# Patient Record
Sex: Male | Born: 1950 | Race: White | Hispanic: No | Marital: Married | Smoking: Current every day smoker
Health system: Southern US, Community
[De-identification: ages and names within clinical notes are randomized; demographics above are authoritative.]

## PROBLEM LIST (undated history)

## (undated) DIAGNOSIS — K219 Gastro-esophageal reflux disease without esophagitis: Secondary | ICD-10-CM

## (undated) DIAGNOSIS — I7090 Unspecified atherosclerosis: Secondary | ICD-10-CM

## (undated) DIAGNOSIS — I10 Essential (primary) hypertension: Secondary | ICD-10-CM

## (undated) DIAGNOSIS — E785 Hyperlipidemia, unspecified: Secondary | ICD-10-CM

## (undated) DIAGNOSIS — R011 Cardiac murmur, unspecified: Secondary | ICD-10-CM

## (undated) HISTORY — DX: Essential (primary) hypertension: I10

## (undated) HISTORY — PX: VASCULAR SURGERY: SHX849

## (undated) HISTORY — DX: Hyperlipidemia, unspecified: E78.5

## (undated) HISTORY — DX: Gastro-esophageal reflux disease without esophagitis: K21.9

---

## 2003-09-16 HISTORY — PX: HERNIA REPAIR: SHX51

## 2007-07-12 ENCOUNTER — Emergency Department: Payer: Self-pay | Admitting: Emergency Medicine

## 2009-02-27 ENCOUNTER — Emergency Department (HOSPITAL_COMMUNITY): Admission: EM | Admit: 2009-02-27 | Discharge: 2009-02-27 | Payer: Self-pay | Admitting: Emergency Medicine

## 2009-06-25 ENCOUNTER — Emergency Department (HOSPITAL_COMMUNITY): Admission: EM | Admit: 2009-06-25 | Discharge: 2009-06-25 | Payer: Self-pay | Admitting: Emergency Medicine

## 2010-12-19 LAB — URINALYSIS, ROUTINE W REFLEX MICROSCOPIC
Bilirubin Urine: NEGATIVE
Hgb urine dipstick: NEGATIVE
Ketones, ur: NEGATIVE mg/dL
Protein, ur: NEGATIVE mg/dL

## 2010-12-19 LAB — POCT I-STAT, CHEM 8
BUN: 8 mg/dL (ref 6–23)
HCT: 41 % (ref 39.0–52.0)
Hemoglobin: 13.9 g/dL (ref 13.0–17.0)
TCO2: 28 mmol/L (ref 0–100)

## 2012-09-15 DIAGNOSIS — I7090 Unspecified atherosclerosis: Secondary | ICD-10-CM

## 2012-09-15 HISTORY — DX: Unspecified atherosclerosis: I70.90

## 2012-12-14 ENCOUNTER — Ambulatory Visit: Payer: Self-pay | Admitting: Family Medicine

## 2013-01-15 ENCOUNTER — Ambulatory Visit: Payer: Self-pay | Admitting: Family Medicine

## 2013-02-23 ENCOUNTER — Ambulatory Visit: Payer: Self-pay | Admitting: Vascular Surgery

## 2013-03-08 ENCOUNTER — Ambulatory Visit: Payer: Self-pay | Admitting: Vascular Surgery

## 2013-03-08 LAB — CREATININE, SERUM: EGFR (African American): >60

## 2013-03-08 LAB — BUN: BUN: 7 mg/dL (ref 7–18)

## 2014-05-05 ENCOUNTER — Other Ambulatory Visit: Payer: Self-pay | Admitting: Vascular Surgery

## 2014-05-05 LAB — CREATININE, SERUM
Creatinine: 0.94 mg/dL (ref 0.60–1.30)
EGFR (Non-African Amer.): >60

## 2014-06-26 IMAGING — US US EXTREM LOW VENOUS*R*
1 series · 14 of 24 positions shown · non-contrast
Comparison: none

REASON FOR EXAM: STAT CallReport [id number] or cell [phone]  RT pain and
tingling  eval DVT
COMMENTS:

[Series 1: us extrem low venous*right* · 0.09mm/px · 14 of 24 slices shown]
[im 1/24]
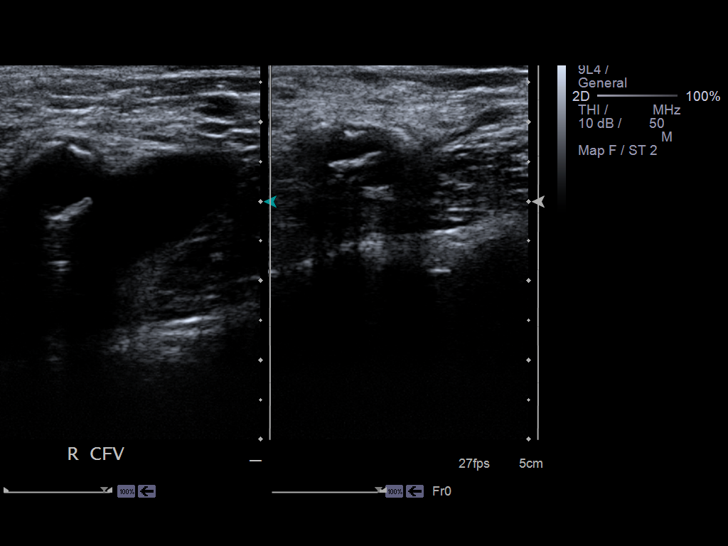
[im 3/24]
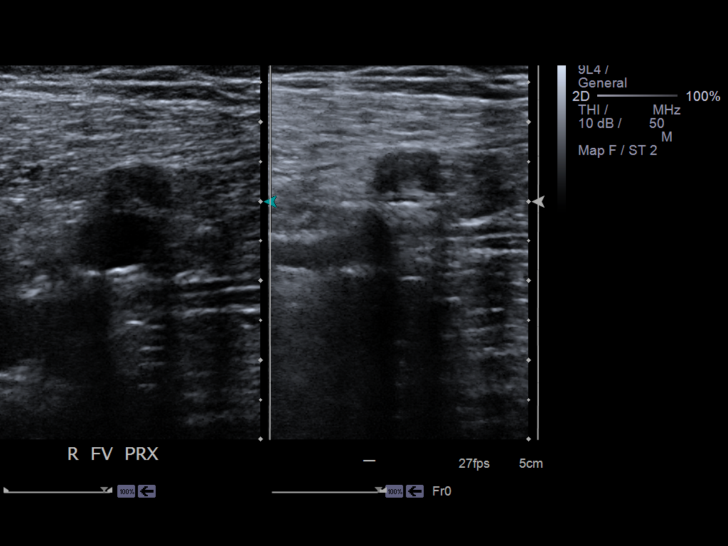
[im 5/24]
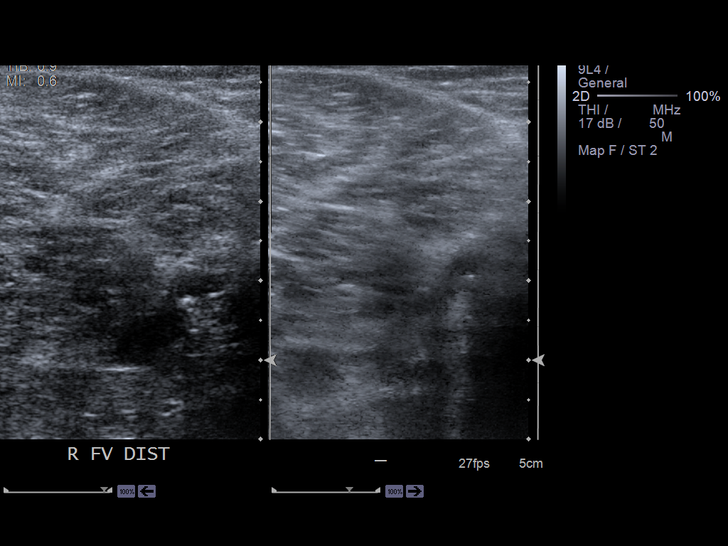
[im 7/24]
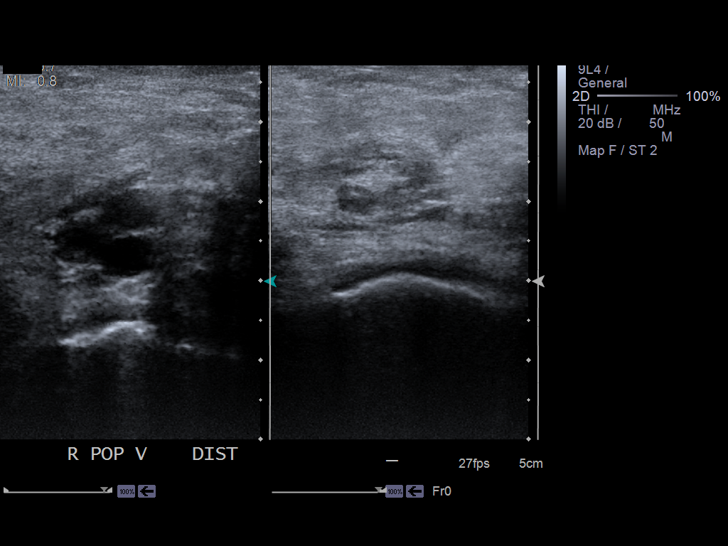
[im 8/24]
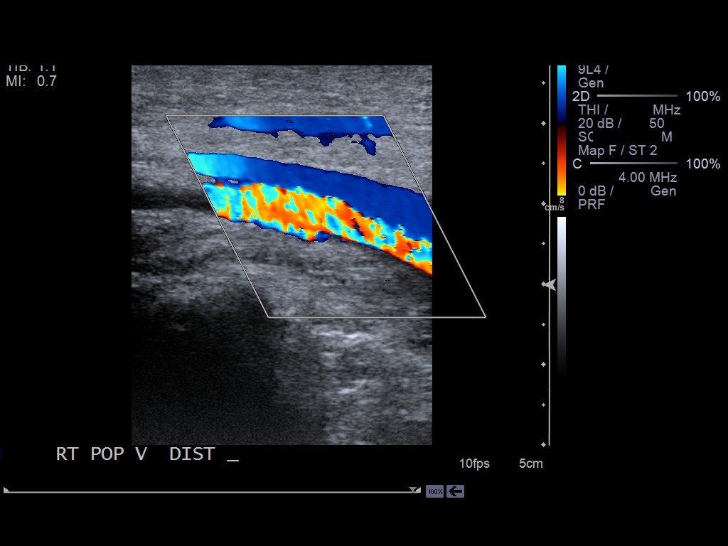
[im 10/24]
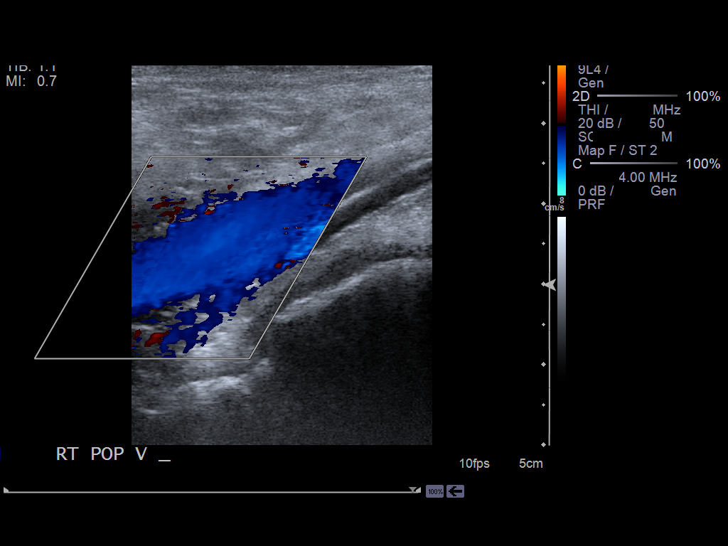
[im 12/24]
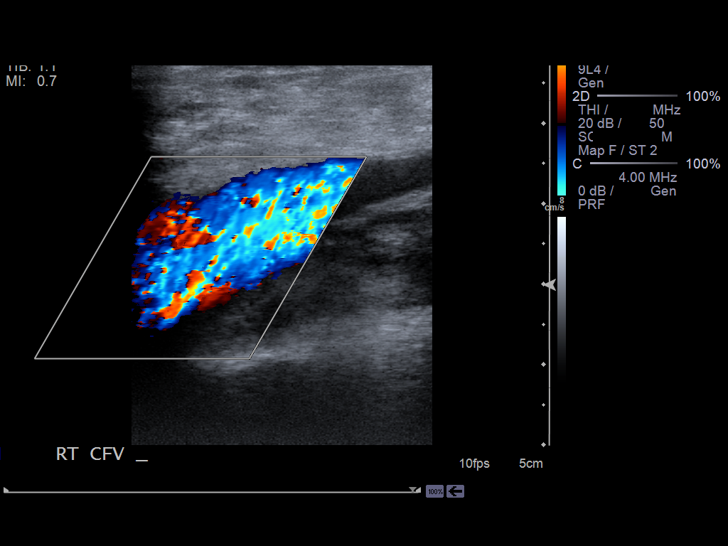
[im 13/24]
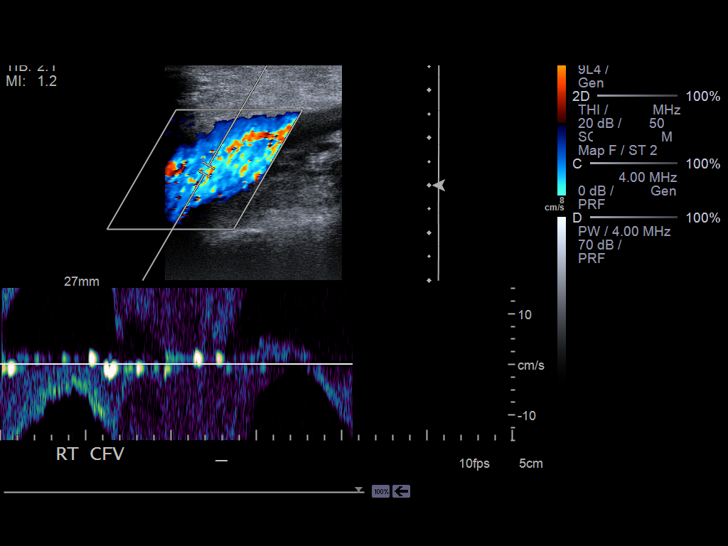
[im 15/24]
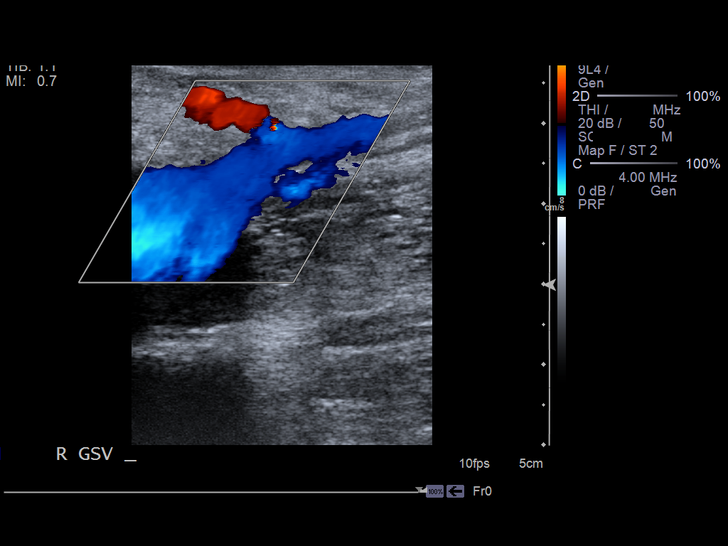
[im 17/24]
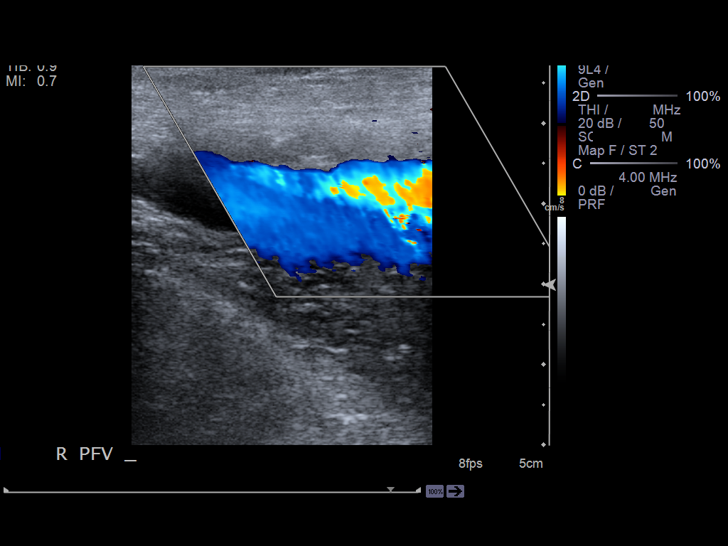
[im 19/24]
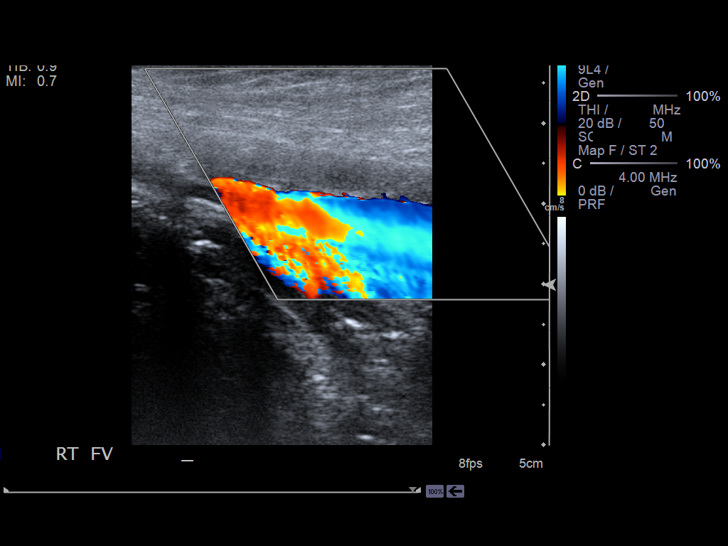
[im 20/24]
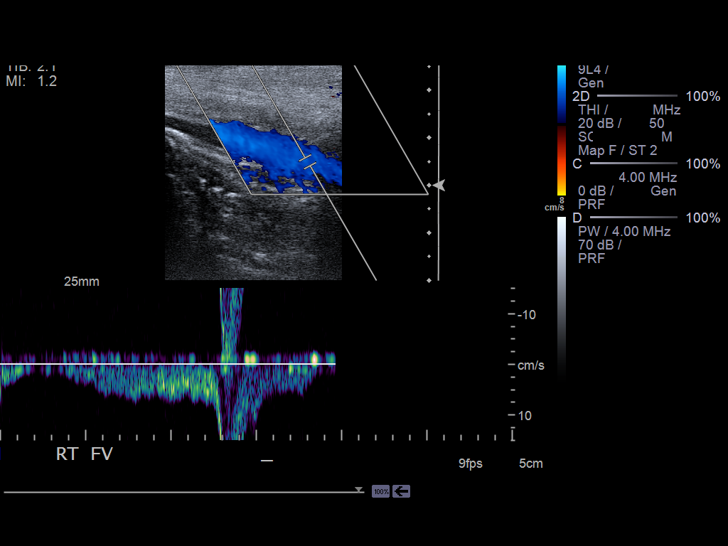
[im 22/24]
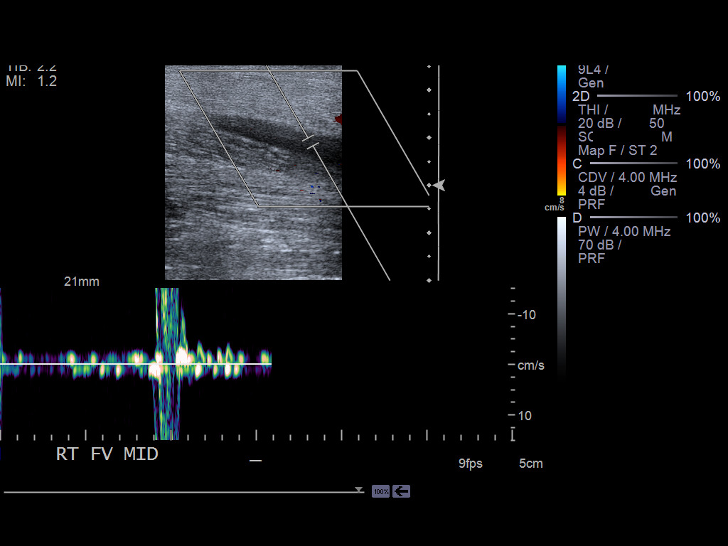
[im 24/24]
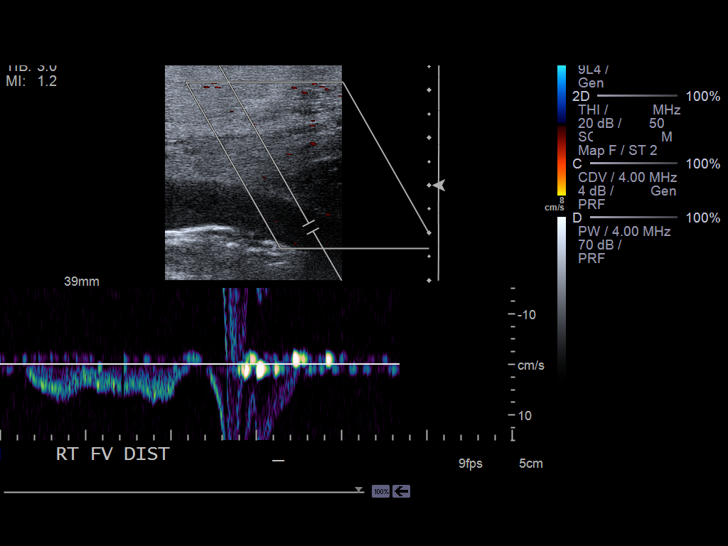

[14 of 24 positions shown; findings below may reference images not displayed]

PROCEDURE:     US  - US DOPPLER LOW EXTR RIGHT  - December 14, 2012 [DATE]

RESULT:     Doppler interrogation of the deep venous system of the right leg
is performed from the common femoral vein through the popliteal vein. Images
demonstrate complete compressibility of the deep venous structures. The
color Doppler and spectral Doppler appearance is within normal limits.
IMPRESSION: No evidence of right lower extremity DVT. No evidence of
popliteal fossa cy[REDACTED]

## 2014-09-05 IMAGING — CT CT ANGIO AORTA RUNOFF
2 series · 12 of 16 positions shown, 14 images · IV contrast (APPLIED)
Comparison: none

REASON FOR EXAM: labs 1st  atherosclorosis with rest pain
COMMENTS:

PROCEDURE:     KCT - KCT ANGIOGRAPHY AORTA RUNOFF  - February 23, 2013  [DATE]
RESULT:     Comparison: None.
TECHNIQUE: Multiple axial images were obtained from the hemidiaphragms to
the feet after the administration of 125 mL Isovue 370 intravenous contrast
according to the CTA runoff protocol. Multiplanar, 3-D, and MIP images were
reviewed on a [employer] multiplanar work station.

[Series 4: 3 soft tissue upper · axial · 0.79mm/px · z∈[-794,-95]mm · 8 of 301 slices shown, 10 images]
[im 34/301  soft-tissue]
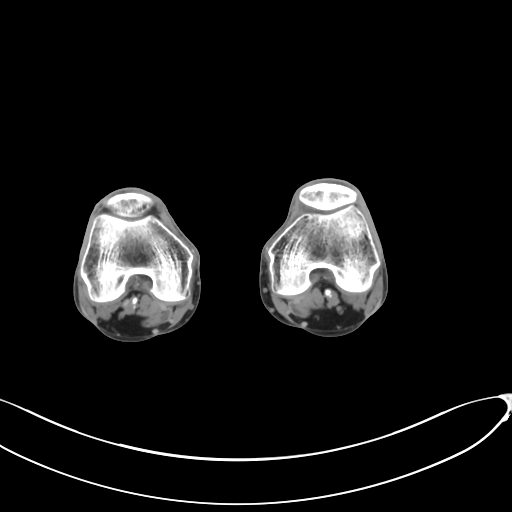
[im 34/301  bone]
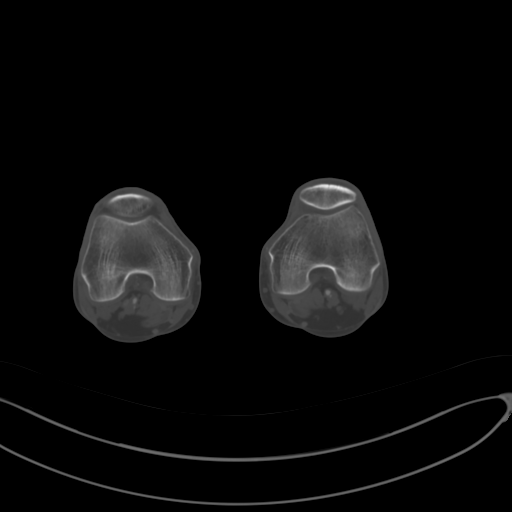
[im 67/301  soft-tissue]
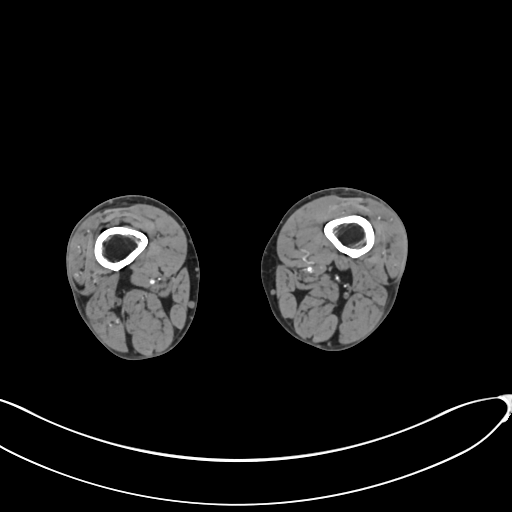
[im 101/301  soft-tissue]
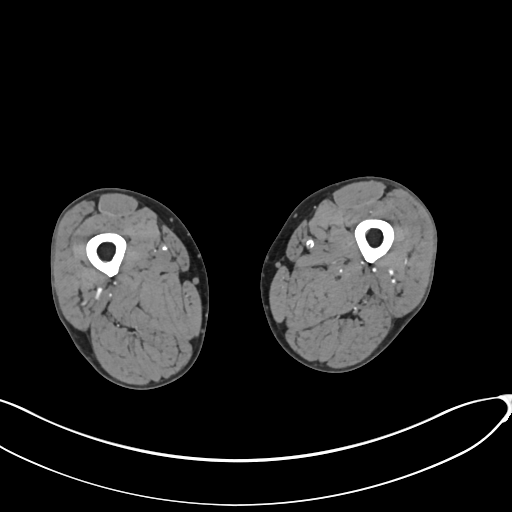
[im 134/301  soft-tissue]
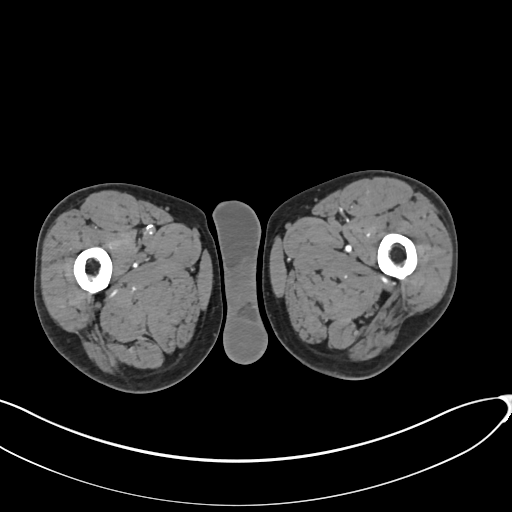
[im 167/301  soft-tissue]
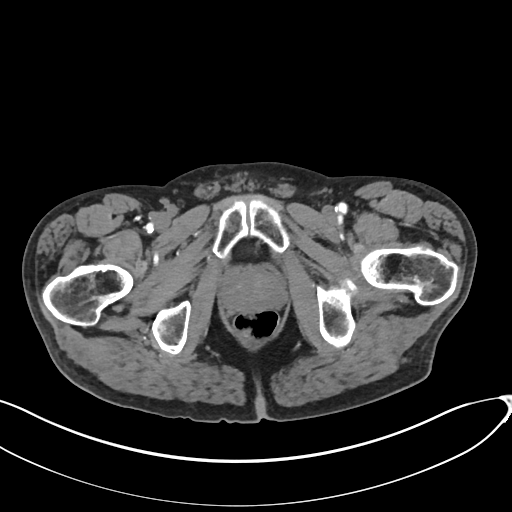
[im 167/301  bone]
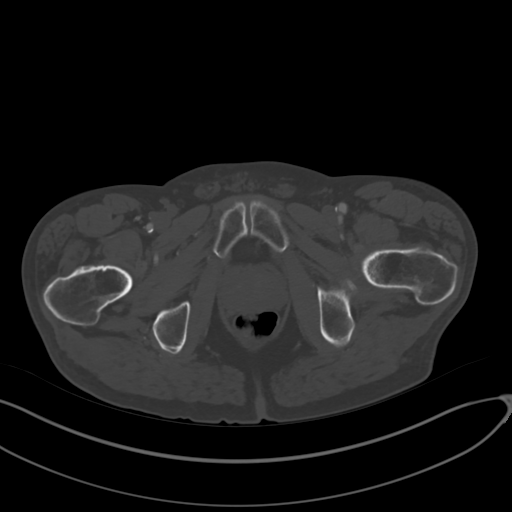
[im 201/301  soft-tissue]
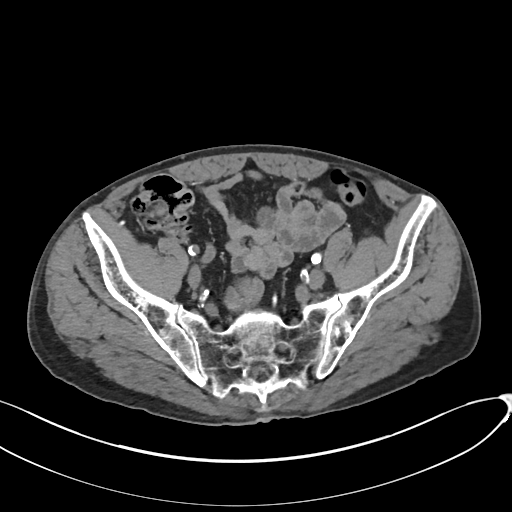
[im 234/301  soft-tissue]
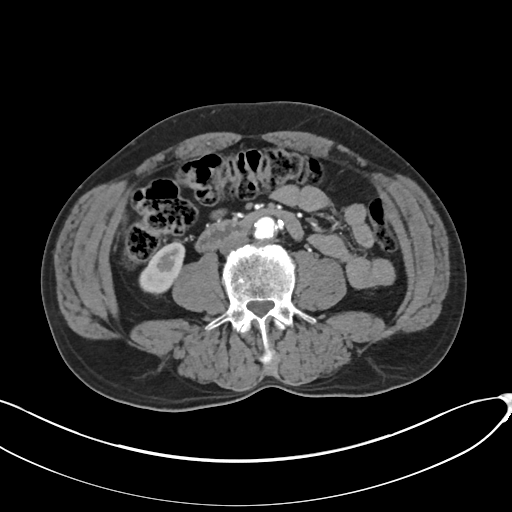
[im 267/301  soft-tissue]
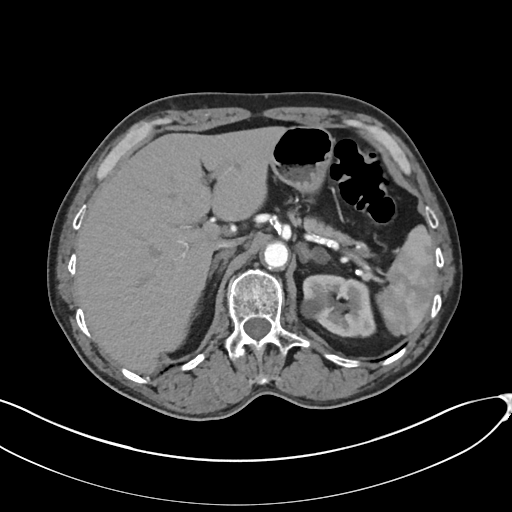

[Series 6: 3 soft tissue lower · axial · 0.69mm/px · z∈[-1202,-851]mm · 4 of 197 slices shown]
[im 40/197  soft-tissue]
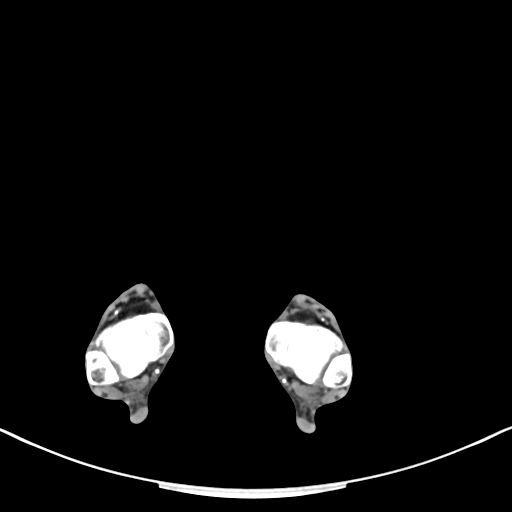
[im 79/197  soft-tissue]
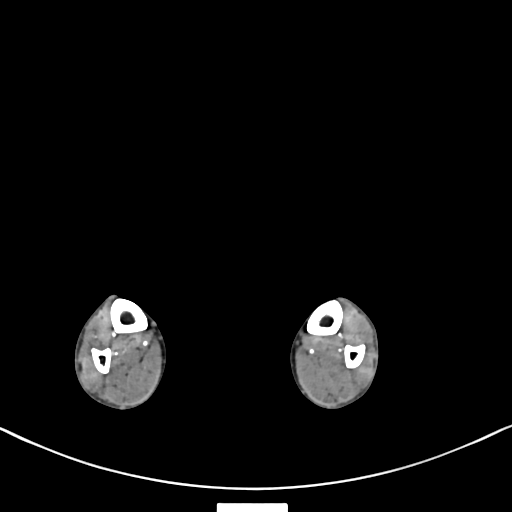
[im 118/197  soft-tissue]
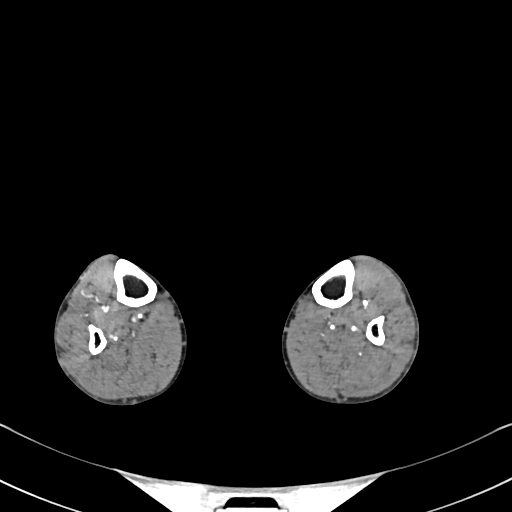
[im 157/197  soft-tissue]
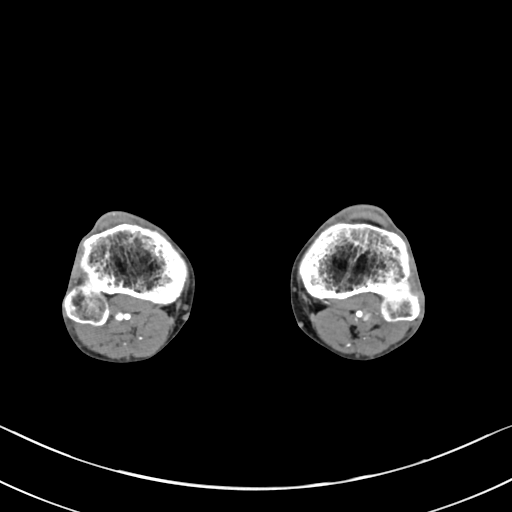

[12 of 16 positions shown; findings below may reference images not displayed]

FINDINGS: The celiac artery, SMA, and IMA are patent. The renal arteries are patent.
There are 2 origins of the right renal artery. Mild atherosclerotic plaques
are demonstrated at the origins of the celiac, SMA, and right renal
arteries. Atherosclerotic left demonstrated in the abdominal aorta. There is
a small ulcerated plaque in the infrarenal abdominal aorta. The abdominal
aorta is normal in caliber.

Atherosclerotic plaque causes approximately 50% stenosis of the left common
iliac artery. There is relative diffuse narrowing of the left external iliac
artery with some areas of approximately 75% stenosis. There are multiple
areas of narrowing in the left internal iliac artery. There is multifocal
narrowing of the left femoral artery with occlusion of the left femoral
artery at the level of the proximal third of the femur The left femoral
artery is reconstituted at the level of the mid femur. There is three-vessel
runoff to the ankle with two-vessel runoff of the anterior tibial and
posterior tibial arteries to the foot.

Complex atherosclerotic plaque in the right common iliac artery causes
approximately 40-50% stenosis. There is occlusion of the right external
iliac artery with reconstitution at the level of the right common femoral
artery. There is mild multifocal narrowing of the right internal iliac
artery. There is occlusion of the right femoral artery at the level of the
bifurcation with the femoral profunda artery. There is reconstitution of the
right femoral artery at the level of the distal third of the right femur.
There is a 3 vessel runoff to the right ankle with two-vessel runoff to the
right foot from the anterior and posterior tibial arteries.

There is a 4 mm subpleural nodule in the right lower lobe, image 14 area
there is suggestion of mild centrilobular emphysema.

Relative arterial phase of contrast limits evaluation of the solid abdominal
organs. Grossly, the liver, gallbladder, and spleen are unremarkable. Small
low-attenuation areas in the pancreatic head and uncinate process appear to
have density of fat and likely represent fat insinuating within the
pancreatic tissue. There multiple small low-attenuation nodule in the
adrenal glands which have density consistent with adrenal adenomas. Small
attenuation lesion in the left kidney is consistent with a cyst.

The small and large bowel are normal in caliber. The appendix is normal. No
aggressive lytic or sclerotic osseous lesions are identified.
IMPRESSION: 1. Occlusion of the right external iliac artery with reconstitution at the
level of the right common femoral artery. There is a long segment occlusion
of the right femoral artery with reconstitution of the distal third of the
right femoral artery.
2. Long segment occlusion of the left femoral artery, with reconstitution at
the level of the mid left femoral artery. Multifocal areas of narrowing of
the left external iliac artery with some areas of approximately 75% stenosis.
3. Indeterminate 4 mm nodule in the right lower lobe. If the patient is at
low risk for lung cancer, no further follow-up is recommended.  If the
patient is at high risk for lung cancer, recommend 12 month follow-up
noncontrast chest CT.

## 2015-01-05 NOTE — Op Note (Signed)
PATIENT NAME:  Jared HartshornSTEWART, Jared M MR#:  [id number] DATE OF BIRTH:  [birth year]  DATE OF PROCEDURE:  03/08/2013  PREOPERATIVE DIAGNOSIS: Atherosclerotic occlusive disease, bilateral lower extremities, with rest pain.   POSTOPERATIVE DIAGNOSIS: Atherosclerotic occlusive disease, bilateral lower extremities, with rest pain.   PROCEDURES PERFORMED:  1. Abdominal aortogram.  2. Bilateral lower extremity distal runoff.  3. Percutaneous transluminal angioplasty and stent placement of the right external iliac artery.  4. Percutaneous transluminal angioplasty and stent placement of the left external iliac artery.   PROCEDURE PERFORMED BY: Renford DillsGregory G. Schnier, M.D.   SEDATION: Versed 4 mg plus fentanyl 150 mcg administered IV. Continuous ECG, pulse oximetry and cardiopulmonary monitoring is performed throughout the entire procedure by the interventional radiology nurse. Total sedation time was approximately 1 hour.   ACCESS: A 6-French sheath left common femoral artery.   FLUOROSCOPY TIME: 7.7 minutes.   CONTRAST: Isovue 125 mL.   INDICATIONS: Mr. Jared RenoStewart is a 64 year old gentleman who presented to the office with increasing pain in his lower extremities. Physical examination as well as noninvasive studies demonstrated profound atherosclerotic occlusive disease, including occlusion of the right external iliac artery. He subsequently was informed of several options, including both intervention as well as operative. The risks and benefits for angiography and possible intervention were reviewed. All questions answered. The patient has agreed to proceed.   DESCRIPTION OF PROCEDURE: The patient is taken to the special procedure suite, placed in the supine position. After adequate sedation is achieved, both groins are prepped and draped in sterile fashion. Ultrasound is placed in a sterile sleeve. Ultrasound is utilized secondary to a lack of appropriate landmarks and to avoid vascular injury. Under direct  ultrasound visualization, the common femoral artery is identified. It is echolucent and pulsatile, indicating patency. Image is recorded for the permanent record. Lidocaine 1% is infiltrated in the soft tissues under ultrasound guidance, and then a micropuncture needle is inserted into the anterior wall of the common femoral artery with continuous ultrasound visualization. Microwire followed by microsheath, J-wire followed by a 5-French sheath and 5-French pigtail catheter. Pigtail catheter is positioned at the level of T12, and AP projection of the aorta is obtained.   The pigtail catheter is then repositioned to above the bifurcation, and bilateral oblique views of the pelvis are obtained. Detector is then placed in the AP position, and bilateral distal runoff is obtained.   Using the pigtail catheter and a stiff angled Glidewire, the aortic bifurcation is crossed and the catheter and wire negotiated down to the occlusion. After manipulation of the wire, the occlusion was crossed. The pigtail catheter was then advanced and positioned in the profunda femoris and profunda runoff was obtained, demonstrating intraluminal positioning and successful crossing of the occlusion. A total of 5000 units of heparin was given, 2000 initially after aortogram showed left external iliac stenosis was near occlusive around the catheter and subsequently another 3000 after crossing the right external iliac artery occlusion. Magic Torque wire was then advanced down into the distal profunda and the 5-French sheath exchanged for a 6-French Balkan sheath which was positioned with its tip in the distal common iliac on the right. Initially, a 6 x 10 balloon was used to angioplasty the right external iliac artery. Angioplasty was to 12 atmospheres for 1 minute. Followup angiography demonstrated significant dissection. Therefore, an 8 x 100 Absolute Pro stent was deployed from just above the level of the ilioinguinal ligament to the  origin of the right external iliac artery. It was postdilated  with a 7 x 100 balloon. Followup angiography demonstrated wide patency of the external iliac artery with rapid flow of contrast and preservation of the profunda in its distal runoff.   The Balkan sheath was then exchanged for a 6-French 11 cm sheath. Retrograde injection of contrast by hand was used to localize the critical stenosis and subsequently, a 6 x 2 balloon was positioned across the lesion which was located just distal to the origin of the external iliac. Followup angiography demonstrated flow-limiting dissection, and subsequently a 7 x 39 Omnilink stent was deployed across this beginning at the origin of the external iliac and extending distally. Followup angiography after deploying the Omnilink stent demonstrated wide patency with 0% residual stenosis. There was preservation of the femoral runoff.   Oblique view of the groin was then obtained, and a Mynx device was successfully deployed. There were no immediate complications.   INTERPRETATION: The abdominal aorta is opacified with a bolus injection of contrast. It is patent throughout its course. It is diffusely diseased, but there are no hemodynamically significant stenoses. Bilateral common iliac arteries again show diffuse disease but no focal hemodynamically significant stenoses. The internal iliac arteries are profoundly diseased with near occlusion on the left and occlusion at the origin on the right. The right external iliac artery is occluded, and there is reconstitution of the common femoral via pelvic branches as well as via the lateral circumflex. There is approximately 1 cm of distal external iliac artery which is patent based on retrograde filling. On the left, the external iliac artery demonstrates a focal stenosis which is a subtotal occlusion. A 5-French pigtail catheter is near occlusive through this lesion.   The right common femoral is patent. There is a moderate  disease noted with a widely patent profunda and good collaterals to reconstitute an above-knee popliteal. SFA is occluded down to the above-knee popliteal. Trifurcation is patent, and there appears to be 3-vessel runoff to the foot.   The left common femoral is patent. There is a focal 50% to 60% stenosis at the origin of the profunda femoris on the left. The SFA occludes 1 to 2 cm distal to its origin. There again is reconstitution of the above-knee popliteal. There is 3-vessel runoff noted on imaging of the tibials.   Following angioplasty and subsequent stent placement in the right external and left external, there is now wide patency of the aortoiliac system filling the common femoral and profunda femoris arteries bilaterally.   SUMMARY: Successful recanalization of the iliac system as described above.   ____________________________ Renford Dills, MD ggs:gb D: 03/08/2013 18:42:09 ET T: 03/09/2013 02:16:16 ET JOB#: [id number]  cc: Renford Dills, MD, <Dictator> [facility] Hilario Quarry, MD Renford Dills MD ELECTRONICALLY SIGNED 03/22/2013 14:29

## 2015-02-16 ENCOUNTER — Encounter: Payer: Self-pay | Admitting: Podiatry

## 2015-02-16 ENCOUNTER — Ambulatory Visit (INDEPENDENT_AMBULATORY_CARE_PROVIDER_SITE_OTHER): Payer: BC Managed Care – PPO | Admitting: Podiatry

## 2015-02-16 VITALS — BP 136/82 | HR 75 | Resp 16 | Ht 70.0 in | Wt 164.2 lb

## 2015-02-16 DIAGNOSIS — M2040 Other hammer toe(s) (acquired), unspecified foot: Secondary | ICD-10-CM | POA: Diagnosis not present

## 2015-02-16 DIAGNOSIS — Q828 Other specified congenital malformations of skin: Secondary | ICD-10-CM | POA: Diagnosis not present

## 2015-02-16 NOTE — Progress Notes (Signed)
   Subjective:    Patient ID: Jared HartshornDelbert M Nanez, male    DOB: [birth year], 64 y.o.   MRN: [id number]  HPI Hard places on the balls of my feet , right foot worse than the left porokeratosis on the ball of foot    Review of Systems     Objective:   Physical Exam: I have reviewed his past medical history medications allergies surgery social history review of systems and work history. Pulses are palpable bilateral. Neurologic sensorium is intact per Semmes-Weinstein monofilament. Deep tendon reflexes are intact bilateral and muscle strength is 5 over 5 dorsiflexors plantar flexors and inverters everters all intrinsic musculature is intact. Orthopedic evaluation does demonstrate some mild flexible hammertoe deformities with flexor stabilization. Cutaneous evaluation of straight supple well-hydrated cutis with exception of poor keratoma's overlying a mild tyloma second metatarsophalangeal joint plantarly. There is no skin breakdown and I see no signs of infection.        Assessment & Plan:  Assessment: Capsulitis hammertoe deformity poor keratoses bilateral.  Plan: Debridement of all reactive hyperkeratosis. Discussed appropriate shoe gear and the fact that he needs to purchase a new pair of shoes. The shoes that he is currently wearing has holes in the bottom. Follow-up with me as needed

## 2015-04-18 ENCOUNTER — Telehealth: Payer: Self-pay | Admitting: Family Medicine

## 2015-04-18 MED ORDER — CARVEDILOL 12.5 MG PO TABS: 12.5000 mg | ORAL_TABLET | Freq: Two times a day (BID) | ORAL | Status: DC

## 2015-04-18 NOTE — Telephone Encounter (Signed)
1 Month Supply of medication has been refilled and sent to [facility], patient has been notified to schedule a follow up appointment be next refill

## 2015-04-23 ENCOUNTER — Ambulatory Visit: Payer: Self-pay | Admitting: Family Medicine

## 2015-04-27 ENCOUNTER — Ambulatory Visit (INDEPENDENT_AMBULATORY_CARE_PROVIDER_SITE_OTHER): Payer: BC Managed Care – PPO | Admitting: Family Medicine

## 2015-04-27 ENCOUNTER — Encounter: Payer: Self-pay | Admitting: Family Medicine

## 2015-04-27 VITALS — BP 132/71 | HR 87 | Temp 98.5°F | Resp 18 | Ht 70.0 in | Wt 163.4 lb

## 2015-04-27 DIAGNOSIS — E785 Hyperlipidemia, unspecified: Secondary | ICD-10-CM | POA: Insufficient documentation

## 2015-04-27 DIAGNOSIS — E538 Deficiency of other specified B group vitamins: Secondary | ICD-10-CM | POA: Diagnosis not present

## 2015-04-27 DIAGNOSIS — R5383 Other fatigue: Secondary | ICD-10-CM | POA: Diagnosis not present

## 2015-04-27 DIAGNOSIS — I10 Essential (primary) hypertension: Secondary | ICD-10-CM | POA: Insufficient documentation

## 2015-04-27 DIAGNOSIS — M5416 Radiculopathy, lumbar region: Secondary | ICD-10-CM | POA: Insufficient documentation

## 2015-04-27 DIAGNOSIS — M792 Neuralgia and neuritis, unspecified: Secondary | ICD-10-CM | POA: Insufficient documentation

## 2015-04-27 MED ORDER — CARVEDILOL 12.5 MG PO TABS: 12.5000 mg | ORAL_TABLET | Freq: Two times a day (BID) | ORAL | Status: DC

## 2015-04-27 MED ORDER — TRIAMTERENE-HCTZ 75-50 MG PO TABS: 1.0000 | ORAL_TABLET | Freq: Every day | ORAL | Status: DC

## 2015-04-27 NOTE — Progress Notes (Signed)
Name: Jared Harmon   MRN: [id number]    DOB: [birth year]   Date:04/27/2015       Progress Note  Subjective  Chief Complaint  Chief Complaint  Patient presents with  . Follow-up    3 mo.  . Hypertension  . Gastrophageal Reflux  . Medication Refill    carvedilol 12.5mg  / triamterene 75/50mg     Hypertension This is a chronic problem. The problem is controlled. Pertinent negatives include no blurred vision, chest pain, headaches, palpitations or shortness of breath. Past treatments include beta blockers and diuretics. There is no history of kidney disease, CAD/MI or CVA.  Hyperlipidemia This is a chronic problem. Recent lipid tests were reviewed and are high. Pertinent negatives include no chest pain, myalgias or shortness of breath. He is currently on no antihyperlipidemic treatment.     Past Medical History  Diagnosis Date  . Allergy   . Hypertension   . GERD (gastroesophageal reflux disease)     Past Surgical History  Procedure Laterality Date  . Hernia repair      Family History  Problem Relation Age of Onset  . Stroke Father     Social History   Social History  . Marital Status: Married    Spouse Name: N/A  . Number of Children: N/A  . Years of Education: N/A   Occupational History  . Not on file.   Social History Main Topics  . Smoking status: Current Every Day Smoker  . Smokeless tobacco: Not on file  . Alcohol Use: Not on file  . Drug Use: Not on file  . Sexual Activity: Not on file   Other Topics Concern  . Not on file   Social History Narrative     Current outpatient prescriptions:  .  aspirin 81 MG tablet, Take 81 mg by mouth daily., Disp: , Rfl:  .  carvedilol (COREG) 12.5 MG tablet, Take 1 tablet (12.5 mg total) by mouth 2 (two) times daily., Disp: 60 tablet, Rfl: 0 .  [facility] VITAMIN B12 1000 MCG tablet, Take 1,000 mcg by mouth daily., Disp: , Rfl: 2 .  gabapentin (NEURONTIN) 300 MG capsule, Take 300 mg by mouth 3 (three) times daily.,  Disp: , Rfl: 6 .  triamterene-hydrochlorothiazide (MAXZIDE) 75-50 MG per tablet, Take 1 tablet by mouth daily., Disp: , Rfl: 0  No Known Allergies   Review of Systems  Eyes: Negative for blurred vision.  Respiratory: Negative for shortness of breath.   Cardiovascular: Negative for chest pain and palpitations.  Musculoskeletal: Negative for myalgias.  Neurological: Negative for headaches.      Objective  Filed Vitals:   04/27/15 0955  BP: 132/71  Pulse: 87  Temp: 98.5 F (36.9 C)  TempSrc: Oral  Resp: 18  Height: 5\' 10"  (1.778 m)  Weight: 163 lb 6.4 oz (74.118 kg)  SpO2: 96%    Physical Exam  Constitutional: He is oriented to person, place, and time and well-developed, well-nourished, and in no distress.  HENT:  Head: Normocephalic and atraumatic.  Cardiovascular: Normal rate and regular rhythm.   Pulmonary/Chest: Effort normal and breath sounds normal.  Musculoskeletal: He exhibits no edema.  Neurological: He is alert and oriented to person, place, and time.  Psychiatric: Memory, affect and judgment normal.  Nursing note and vitals reviewed.   Assessment & Plan  1. Essential hypertension Blood pressure stable on present therapy. - carvedilol (COREG) 12.5 MG tablet; Take 1 tablet (12.5 mg total) by mouth 2 (two) times daily.  Dispense:  180 tablet; Refill: 0 - triamterene-hydrochlorothiazide (MAXZIDE) 75-50 MG per tablet; Take 1 tablet by mouth daily.  Dispense: 90 tablet; Refill: 0  2. Dyslipidemia Recheck FLP and consider starting patient on a statin. - Lipid Profile - Comprehensive metabolic panel  3. B12 deficiency  - B12  4. Other fatigue  - TSH   Alfreda Hammad Asad A. [facility] 04/27/2015 10:23 AM

## 2015-05-05 LAB — COMPREHENSIVE METABOLIC PANEL
A/G RATIO: 1.9 (ref 1.1–2.5)
ALBUMIN: 4.5 g/dL (ref 3.6–4.8)
ALK PHOS: 86 IU/L (ref 39–117)
ALT: 13 IU/L (ref 0–44)
AST: 15 IU/L (ref 0–40)
BILIRUBIN TOTAL: 0.4 mg/dL (ref 0.0–1.2)
BUN / CREAT RATIO: 11 (ref 10–22)
BUN: 10 mg/dL (ref 8–27)
CHLORIDE: 91 mmol/L — AB (ref 97–108)
CO2: 28 mmol/L (ref 18–29)
CREATININE: 0.92 mg/dL (ref 0.76–1.27)
Calcium: 9.5 mg/dL (ref 8.6–10.2)
GFR calc Af Amer: 101 mL/min/{1.73_m2} (ref >59)
GFR calc non Af Amer: 87 mL/min/{1.73_m2} (ref >59)
GLOBULIN, TOTAL: 2.4 g/dL (ref 1.5–4.5)
Glucose: 111 mg/dL — ABNORMAL HIGH (ref 65–99)
POTASSIUM: 3.5 mmol/L (ref 3.5–5.2)
SODIUM: 135 mmol/L (ref 134–144)
Total Protein: 6.9 g/dL (ref 6.0–8.5)

## 2015-05-05 LAB — VITAMIN B12: VITAMIN B 12: 636 pg/mL (ref 211–946)

## 2015-05-05 LAB — LIPID PANEL
CHOLESTEROL TOTAL: 171 mg/dL (ref 100–199)
Chol/HDL Ratio: 4.8 ratio units (ref 0.0–5.0)
HDL: 36 mg/dL — AB (ref >39)
LDL CALC: 115 mg/dL — AB (ref 0–99)
Triglycerides: 101 mg/dL (ref 0–149)
VLDL CHOLESTEROL CAL: 20 mg/dL (ref 5–40)

## 2015-05-05 LAB — TSH: TSH: 1.19 u[IU]/mL (ref 0.450–4.500)

## 2015-05-14 ENCOUNTER — Telehealth: Payer: Self-pay | Admitting: Family Medicine

## 2015-05-14 MED ORDER — ATORVASTATIN CALCIUM 20 MG PO TABS: 20.0000 mg | ORAL_TABLET | Freq: Every day | ORAL | Status: DC

## 2015-05-14 NOTE — Telephone Encounter (Signed)
lipitor 20 mg has been sent to [facility] per Dr. Sherryll Burger

## 2015-06-16 ENCOUNTER — Other Ambulatory Visit: Payer: Self-pay | Admitting: Family Medicine

## 2015-07-09 ENCOUNTER — Other Ambulatory Visit: Payer: Self-pay | Admitting: Family Medicine

## 2015-07-26 ENCOUNTER — Other Ambulatory Visit: Payer: Self-pay | Admitting: Family Medicine

## 2015-07-30 ENCOUNTER — Encounter: Payer: Self-pay | Admitting: Family Medicine

## 2015-07-30 ENCOUNTER — Ambulatory Visit (INDEPENDENT_AMBULATORY_CARE_PROVIDER_SITE_OTHER): Payer: BC Managed Care – PPO | Admitting: Family Medicine

## 2015-07-30 VITALS — BP 128/76 | HR 79 | Temp 98.0°F | Resp 18 | Ht 70.0 in | Wt 163.8 lb

## 2015-07-30 DIAGNOSIS — G629 Polyneuropathy, unspecified: Secondary | ICD-10-CM | POA: Insufficient documentation

## 2015-07-30 DIAGNOSIS — E785 Hyperlipidemia, unspecified: Secondary | ICD-10-CM | POA: Diagnosis not present

## 2015-07-30 DIAGNOSIS — R569 Unspecified convulsions: Secondary | ICD-10-CM | POA: Diagnosis not present

## 2015-07-30 DIAGNOSIS — M5030 Other cervical disc degeneration, unspecified cervical region: Secondary | ICD-10-CM | POA: Insufficient documentation

## 2015-07-30 DIAGNOSIS — M48062 Spinal stenosis, lumbar region with neurogenic claudication: Secondary | ICD-10-CM | POA: Insufficient documentation

## 2015-07-30 DIAGNOSIS — I10 Essential (primary) hypertension: Secondary | ICD-10-CM

## 2015-07-30 DIAGNOSIS — G9519 Other vascular myelopathies: Secondary | ICD-10-CM | POA: Insufficient documentation

## 2015-07-30 NOTE — Progress Notes (Signed)
Name: Jared Harmon   MRN: [id number]    DOB: [birth year]   Date:07/30/2015       Progress Note  Subjective  Chief Complaint  Chief Complaint  Patient presents with  . Follow-up    3 mo  . Hyperlipidemia  . Hypertension    Hyperlipidemia This is a chronic problem. The problem is controlled. Recent lipid tests were reviewed and are high (elevated LDL). Pertinent negatives include no chest pain, leg pain, myalgias or shortness of breath. Current antihyperlipidemic treatment includes statins. There are no compliance problems.  Risk factors for coronary artery disease include dyslipidemia, male sex and hypertension.  Hypertension This is a chronic problem. The problem is unchanged. The problem is controlled. Pertinent negatives include no blurred vision, chest pain, headaches, palpitations or shortness of breath. Past treatments include beta blockers and diuretics. There is no history of kidney disease, CAD/MI or CVA.  Seizures  This is a new problem. Episode onset: 3 weeks ago. The problem has been resolved. There was 1 seizure. Associated symptoms include vomiting. Pertinent negatives include no headaches, no speech difficulty, no visual disturbance, no chest pain, no cough, no nausea and no muscle weakness. Characteristics include bladder incontinence, loss of consciousness and bit tongue. Characteristics do not include bowel incontinence, rhythmic jerking or apnea. The episode was witnessed (by wife). The seizure(s) had no focality. Possible causes do not include sleep deprivation or recent illness.    Past Medical History  Diagnosis Date  . Allergy   . Hypertension   . GERD (gastroesophageal reflux disease)     Past Surgical History  Procedure Laterality Date  . Hernia repair      Family History  Problem Relation Age of Onset  . Stroke Father     Social History   Social History  . Marital Status: Married    Spouse Name: N/A  . Number of Children: N/A  . Years of  Education: N/A   Occupational History  . Not on file.   Social History Main Topics  . Smoking status: Current Every Day Smoker  . Smokeless tobacco: Not on file  . Alcohol Use: Not on file  . Drug Use: Not on file  . Sexual Activity: Not on file   Other Topics Concern  . Not on file   Social History Narrative     Current outpatient prescriptions:  .  aspirin 81 MG tablet, Take 81 mg by mouth daily., Disp: , Rfl:  .  atorvastatin (LIPITOR) 20 MG tablet, TAKE 1 TABLET (20 MG TOTAL) BY MOUTH AT BEDTIME., Disp: 30 tablet, Rfl: 1 .  carvedilol (COREG) 12.5 MG tablet, TAKE 1 TABLET (12.5 MG TOTAL) BY MOUTH 2 (TWO) TIMES DAILY., Disp: 180 tablet, Rfl: 0 .  [facility] VITAMIN B12 1000 MCG tablet, Take 1,000 mcg by mouth daily., Disp: , Rfl: 2 .  gabapentin (NEURONTIN) 300 MG capsule, Take 300 mg by mouth 3 (three) times daily., Disp: , Rfl: 6 .  triamterene-hydrochlorothiazide (MAXZIDE) 75-50 MG tablet, TAKE 1 TABLET BY MOUTH EVERY DAY, Disp: 90 tablet, Rfl: 0  No Known Allergies   Review of Systems  Constitutional: Negative for fever and chills.  Eyes: Negative for blurred vision and visual disturbance.  Respiratory: Negative for apnea, cough and shortness of breath.   Cardiovascular: Negative for chest pain and palpitations.  Gastrointestinal: Positive for vomiting. Negative for nausea and bowel incontinence.  Genitourinary: Positive for bladder incontinence.  Musculoskeletal: Negative for myalgias.  Neurological: Positive for seizures and loss of  consciousness. Negative for speech difficulty and headaches.     Objective  Filed Vitals:   07/30/15 0844  BP: 128/76  Pulse: 79  Temp: 98 F (36.7 C)  TempSrc: Oral  Resp: 18  Height: 5\' 10"  (1.778 m)  Weight: 163 lb 12.8 oz (74.299 kg)  SpO2: 97%    Physical Exam  Constitutional: He is oriented to person, place, and time and well-developed, well-nourished, and in no distress.  HENT:  Head: Normocephalic and atraumatic.   Eyes: Pupils are equal, round, and reactive to light.  Cardiovascular: Normal rate, regular rhythm and normal heart sounds.   No murmur heard. Pulmonary/Chest: Effort normal and breath sounds normal. He has no rales.  Musculoskeletal: Normal range of motion.  Neurological: He is alert and oriented to person, place, and time.  Skin: Skin is warm and dry.  Psychiatric: Mood, memory, affect and judgment normal.  Nursing note and vitals reviewed.   Assessment & Plan  1. Essential hypertension BP at goal. - Comprehensive Metabolic Panel (CMET)  2. Dyslipidemia  Started on statin therapy since August 2016 for elevated LDL. No side effects. Recheck today. - Lipid Profile - Comprehensive Metabolic Panel (CMET)  3. Observed seizure-like activity (HCC)  Prior study of seizure according to wife, which was never evaluated. No obvious factors and unclear etiology at present. We'll refer to neurology for complete workup including EEG.  - CBC with Differential - Comprehensive Metabolic Panel (CMET) - Ambulatory referral to Neurology   [facility] Asad A. [facility] 07/30/2015 9:20 AM

## 2015-08-27 ENCOUNTER — Other Ambulatory Visit: Payer: Self-pay | Admitting: Family Medicine

## 2015-11-06 ENCOUNTER — Ambulatory Visit: Payer: BC Managed Care – PPO | Admitting: Family Medicine

## 2016-01-21 ENCOUNTER — Other Ambulatory Visit: Payer: Self-pay | Admitting: Family Medicine

## 2016-01-22 ENCOUNTER — Other Ambulatory Visit: Payer: Self-pay | Admitting: Family Medicine

## 2016-02-20 ENCOUNTER — Encounter: Payer: Self-pay | Admitting: Family Medicine

## 2016-02-20 ENCOUNTER — Ambulatory Visit (INDEPENDENT_AMBULATORY_CARE_PROVIDER_SITE_OTHER): Payer: BC Managed Care – PPO | Admitting: Family Medicine

## 2016-02-20 VITALS — BP 126/78 | HR 81 | Temp 97.7°F | Resp 17 | Ht 70.0 in | Wt 157.9 lb

## 2016-02-20 DIAGNOSIS — I10 Essential (primary) hypertension: Secondary | ICD-10-CM

## 2016-02-20 DIAGNOSIS — R739 Hyperglycemia, unspecified: Secondary | ICD-10-CM

## 2016-02-20 DIAGNOSIS — E785 Hyperlipidemia, unspecified: Secondary | ICD-10-CM | POA: Diagnosis not present

## 2016-02-20 LAB — POCT GLYCOSYLATED HEMOGLOBIN (HGB A1C): HEMOGLOBIN A1C: 6.1

## 2016-02-20 LAB — GLUCOSE, POCT (MANUAL RESULT ENTRY): POC Glucose: 121 mg/dl — AB (ref 70–99)

## 2016-02-20 MED ORDER — TRIAMTERENE-HCTZ 75-50 MG PO TABS: 1.0000 | ORAL_TABLET | Freq: Every day | ORAL | Status: DC

## 2016-02-20 MED ORDER — CARVEDILOL 12.5 MG PO TABS: 12.5000 mg | ORAL_TABLET | Freq: Every day | ORAL | Status: DC

## 2016-02-20 MED ORDER — ATORVASTATIN CALCIUM 20 MG PO TABS: 20.0000 mg | ORAL_TABLET | Freq: Every day | ORAL | Status: DC

## 2016-02-20 NOTE — Progress Notes (Signed)
Name: Jared HartshornDelbert M Harmon   MRN: [id number]    DOB: [birth year]   Date:02/20/2016       Progress Note  Subjective  Chief Complaint  Chief Complaint  Patient presents with  . Medication Refill    carvediol 12.5 mg / atorvastatin / triamterene 75-50 mg     Hyperlipidemia This is a chronic problem. The problem is uncontrolled. Recent lipid tests were reviewed and are high (elevated LDL). Pertinent negatives include no chest pain, leg pain, myalgias or shortness of breath. Current antihyperlipidemic treatment includes statins (Stopped Lipitor 6 months ago.). Compliance problems include medication cost.  Risk factors for coronary artery disease include dyslipidemia, male sex and hypertension.  Hypertension This is a chronic problem. The problem is unchanged. The problem is controlled. Pertinent negatives include no blurred vision, chest pain, headaches, palpitations or shortness of breath. Past treatments include beta blockers and diuretics. There is no history of kidney disease, CAD/MI or CVA.    Past Medical History  Diagnosis Date  . Allergy   . Hypertension   . GERD (gastroesophageal reflux disease)     Past Surgical History  Procedure Laterality Date  . Hernia repair      Family History  Problem Relation Age of Onset  . Stroke Father     Social History   Social History  . Marital Status: Married    Spouse Name: N/A  . Number of Children: N/A  . Years of Education: N/A   Occupational History  . Not on file.   Social History Main Topics  . Smoking status: Current Every Day Smoker  . Smokeless tobacco: Not on file  . Alcohol Use: Not on file  . Drug Use: Not on file  . Sexual Activity: Not on file   Other Topics Concern  . Not on file   Social History Narrative     Current outpatient prescriptions:  .  aspirin 81 MG tablet, Take 81 mg by mouth daily., Disp: , Rfl:  .  atorvastatin (LIPITOR) 20 MG tablet, TAKE 1 TABLET (20 MG TOTAL) BY MOUTH AT BEDTIME., Disp: 30  tablet, Rfl: 1 .  carvedilol (COREG) 12.5 MG tablet, TAKE 1 TABLET (12.5 MG TOTAL) BY MOUTH 2 (TWO) TIMES DAILY., Disp: 180 tablet, Rfl: 0 .  gabapentin (NEURONTIN) 300 MG capsule, Take 300 mg by mouth 3 (three) times daily., Disp: , Rfl: 6 .  triamterene-hydrochlorothiazide (MAXZIDE) 75-50 MG tablet, TAKE 1 TABLET BY MOUTH EVERY DAY, Disp: 90 tablet, Rfl: 0 .  [facility] VITAMIN B12 1000 MCG tablet, Take 1,000 mcg by mouth daily. Reported on 02/20/2016, Disp: , Rfl: 2  No Known Allergies   Review of Systems  Eyes: Negative for blurred vision.  Respiratory: Negative for shortness of breath.   Cardiovascular: Negative for chest pain and palpitations.  Musculoskeletal: Negative for myalgias.  Neurological: Negative for headaches.    Objective  Filed Vitals:   02/20/16 0850  BP: 126/78  Pulse: 81  Temp: 97.7 F (36.5 C)  TempSrc: Oral  Resp: 17  Height: 5\' 10"  (1.778 m)  Weight: 157 lb 14.4 oz (71.623 kg)  SpO2: 97%    Physical Exam  Constitutional: He is oriented to person, place, and time and well-developed, well-nourished, and in no distress.  HENT:  Head: Normocephalic and atraumatic.  Eyes: Conjunctivae are normal. Pupils are equal, round, and reactive to light.  Cardiovascular: Normal rate, regular rhythm and normal heart sounds.  Exam reveals no gallop.   No murmur heard. Pulmonary/Chest: Effort normal  and breath sounds normal. He has no wheezes.  Musculoskeletal: He exhibits no edema.  Neurological: He is alert and oriented to person, place, and time.  Nursing note and vitals reviewed.      Assessment & Plan  1. Essential hypertension Blood pressure is stable, changed carvedilol from twice a day to once a day. Refills provided - carvedilol (COREG) 12.5 MG tablet; Take 1 tablet (12.5 mg total) by mouth daily.  Dispense: 90 tablet; Refill: 1 - triamterene-hydrochlorothiazide (MAXZIDE) 75-50 MG tablet; Take 1 tablet by mouth daily.  Dispense: 90 tablet; Refill: 1  2.  Dyslipidemia Stopped taking Lipitor after 1 month. Recheck FLP and refill for Lipitor provided - atorvastatin (LIPITOR) 20 MG tablet; Take 1 tablet (20 mg total) by mouth daily at 6 PM.  Dispense: 90 tablet; Refill: 1 - Lipid Profile - Comprehensive Metabolic Panel (CMET)  3. Hyperglycemia  - POCT HgB A1C - POCT Glucose (CBG)   Jared Harmon Jared A[facility] 02/20/2016 9:02 AM

## 2016-02-21 LAB — COMPREHENSIVE METABOLIC PANEL
ALBUMIN: 4.4 g/dL (ref 3.6–4.8)
ALT: 10 IU/L (ref 0–44)
AST: 13 IU/L (ref 0–40)
Albumin/Globulin Ratio: 1.6 (ref 1.2–2.2)
Alkaline Phosphatase: 102 IU/L (ref 39–117)
BILIRUBIN TOTAL: 0.4 mg/dL (ref 0.0–1.2)
BUN / CREAT RATIO: 12 (ref 10–24)
BUN: 10 mg/dL (ref 8–27)
CALCIUM: 9.6 mg/dL (ref 8.6–10.2)
CHLORIDE: 96 mmol/L (ref 96–106)
CO2: 27 mmol/L (ref 18–29)
CREATININE: 0.86 mg/dL (ref 0.76–1.27)
GFR, EST AFRICAN AMERICAN: 104 mL/min/{1.73_m2} (ref >59)
GFR, EST NON AFRICAN AMERICAN: 90 mL/min/{1.73_m2} (ref >59)
GLUCOSE: 107 mg/dL — AB (ref 65–99)
Globulin, Total: 2.8 g/dL (ref 1.5–4.5)
Potassium: 4.9 mmol/L (ref 3.5–5.2)
Sodium: 137 mmol/L (ref 134–144)
TOTAL PROTEIN: 7.2 g/dL (ref 6.0–8.5)

## 2016-02-21 LAB — LIPID PANEL
CHOLESTEROL TOTAL: 159 mg/dL (ref 100–199)
Chol/HDL Ratio: 4 ratio units (ref 0.0–5.0)
HDL: 40 mg/dL (ref >39)
LDL CALC: 105 mg/dL — AB (ref 0–99)
TRIGLYCERIDES: 71 mg/dL (ref 0–149)
VLDL CHOLESTEROL CAL: 14 mg/dL (ref 5–40)

## 2016-02-22 ENCOUNTER — Telehealth: Payer: Self-pay

## 2016-02-22 MED ORDER — ATORVASTATIN CALCIUM 40 MG PO TABS: 40.0000 mg | ORAL_TABLET | Freq: Every day | ORAL | Status: DC

## 2016-02-22 NOTE — Telephone Encounter (Signed)
Lab results have been reported to patient and a prescription for Atorvastatin 40 mg at bedtime has been sent to [facility] per Dr. Sherryll BurgerShah and patient has been notified

## 2016-04-08 ENCOUNTER — Other Ambulatory Visit: Payer: Self-pay | Admitting: Family Medicine

## 2016-05-24 ENCOUNTER — Other Ambulatory Visit: Payer: Self-pay | Admitting: Family Medicine

## 2016-08-13 ENCOUNTER — Other Ambulatory Visit: Payer: Self-pay | Admitting: Family Medicine

## 2016-08-13 DIAGNOSIS — I10 Essential (primary) hypertension: Secondary | ICD-10-CM

## 2016-08-21 ENCOUNTER — Encounter: Payer: Self-pay | Admitting: Family Medicine

## 2016-08-21 ENCOUNTER — Other Ambulatory Visit: Payer: Self-pay | Admitting: Family Medicine

## 2016-08-21 ENCOUNTER — Ambulatory Visit (INDEPENDENT_AMBULATORY_CARE_PROVIDER_SITE_OTHER): Payer: BC Managed Care – PPO | Admitting: Family Medicine

## 2016-08-21 VITALS — BP 142/86 | HR 81 | Temp 98.5°F | Resp 18 | Ht 70.0 in | Wt 165.1 lb

## 2016-08-21 DIAGNOSIS — R011 Cardiac murmur, unspecified: Secondary | ICD-10-CM

## 2016-08-21 DIAGNOSIS — E785 Hyperlipidemia, unspecified: Secondary | ICD-10-CM | POA: Diagnosis not present

## 2016-08-21 DIAGNOSIS — I10 Essential (primary) hypertension: Secondary | ICD-10-CM

## 2016-08-21 MED ORDER — CARVEDILOL 12.5 MG PO TABS: 12.5000 mg | ORAL_TABLET | Freq: Every day | ORAL | 1 refills | Status: DC

## 2016-08-21 MED ORDER — TRIAMTERENE-HCTZ 75-50 MG PO TABS: 1.0000 | ORAL_TABLET | Freq: Every day | ORAL | 1 refills | Status: DC

## 2016-08-21 MED ORDER — ATORVASTATIN CALCIUM 40 MG PO TABS: 40.0000 mg | ORAL_TABLET | Freq: Every day | ORAL | 1 refills | Status: DC

## 2016-08-21 NOTE — Progress Notes (Signed)
Name: Jared HartshornDelbert M Harmon   MRN: [id number]    DOB: Feb 03, 1951   Date:08/21/2016       Progress Note  Subjective  Chief Complaint  Chief Complaint  Patient presents with  . Hypertension    follow up medication refill  . Hyperlipidemia    Hypertension  This is a chronic problem. The problem is unchanged. The problem is uncontrolled. Pertinent negatives include no blurred vision, chest pain, headaches, palpitations or shortness of breath. Past treatments include beta blockers and diuretics. There is no history of kidney disease, CAD/MI or CVA.  Hyperlipidemia  This is a chronic problem. The problem is uncontrolled. Recent lipid tests were reviewed and are high (elevated LDL). Pertinent negatives include no chest pain, leg pain, myalgias or shortness of breath. Current antihyperlipidemic treatment includes statins (Stopped Lipitor 6 months ago.). Compliance problems include medication cost.  Risk factors for coronary artery disease include dyslipidemia, male sex and hypertension.      Past Medical History:  Diagnosis Date  . Allergy   . GERD (gastroesophageal reflux disease)   . Hypertension     Past Surgical History:  Procedure Laterality Date  . HERNIA REPAIR      Family History  Problem Relation Age of Onset  . Stroke Father     Social History   Social History  . Marital status: Married    Spouse name: N/A  . Number of children: N/A  . Years of education: N/A   Occupational History  . Not on file.   Social History Main Topics  . Smoking status: Current Every Day Smoker  . Smokeless tobacco: Not on file  . Alcohol use Not on file  . Drug use: Unknown  . Sexual activity: Not on file   Other Topics Concern  . Not on file   Social History Narrative  . No narrative on file     Current Outpatient Prescriptions:  .  aspirin 81 MG tablet, Take 81 mg by mouth daily., Disp: , Rfl:  .  atorvastatin (LIPITOR) 40 MG tablet, TAKE 1 TABLET (40 MG TOTAL) BY MOUTH AT  BEDTIME., Disp: 90 tablet, Rfl: 0 .  carvedilol (COREG) 12.5 MG tablet, Take 1 tablet (12.5 mg total) by mouth daily., Disp: 90 tablet, Rfl: 1 .  [facility] VITAMIN B12 1000 MCG tablet, Take 1,000 mcg by mouth daily. Reported on 02/20/2016, Disp: , Rfl: 2 .  gabapentin (NEURONTIN) 300 MG capsule, Take 300 mg by mouth 3 (three) times daily., Disp: , Rfl: 6 .  triamterene-hydrochlorothiazide (MAXZIDE) 75-50 MG tablet, Take 1 tablet by mouth daily., Disp: 90 tablet, Rfl: 1  No Known Allergies   Review of Systems  Eyes: Negative for blurred vision.  Respiratory: Negative for shortness of breath.   Cardiovascular: Negative for chest pain and palpitations.  Musculoskeletal: Negative for myalgias.  Neurological: Negative for headaches.    Objective  Vitals:   08/21/16 0849  BP: (!) 142/86  Pulse: 81  Resp: 18  Temp: 98.5 F (36.9 C)  TempSrc: Oral  SpO2: 98%  Weight: 165 lb 1.6 oz (74.9 kg)  Height: 5\' 10"  (1.778 m)    Physical Exam  Constitutional: He is oriented to person, place, and time and well-developed, well-nourished, and in no distress.  HENT:  Head: Normocephalic and atraumatic.  Eyes: Conjunctivae are normal. Pupils are equal, round, and reactive to light.  Cardiovascular: Normal rate and regular rhythm.  Exam reveals no gallop.   Murmur heard.  Systolic murmur is present with a  grade of 2/6  Pulmonary/Chest: Effort normal and breath sounds normal. He has no wheezes.  Abdominal: Soft. Bowel sounds are normal. There is no tenderness.  Musculoskeletal: He exhibits no edema.  Neurological: He is alert and oriented to person, place, and time.  Psychiatric: Mood, memory, affect and judgment normal.  Nursing note and vitals reviewed.     Assessment & Plan  1. Essential hypertension BP elevated as patient has not taken diuretics for 3 days, ran out of prescription. Refills provided, follow-up in 3 months - triamterene-hydrochlorothiazide (MAXZIDE) 75-50 MG tablet; Take 1  tablet by mouth daily.  Dispense: 90 tablet; Refill: 1 - carvedilol (COREG) 12.5 MG tablet; Take 1 tablet (12.5 mg total) by mouth daily.  Dispense: 90 tablet; Refill: 1  2. Dyslipidemia Continue on statin, recheck FLP - Lipid Profile - COMPLETE METABOLIC PANEL WITH GFR - atorvastatin (LIPITOR) 40 MG tablet; Take 1 tablet (40 mg total) by mouth at bedtime.  Dispense: 90 tablet; Refill: 1  3. Cardiac murmur Referral to cardiology for possible echocardiogram to evaluate. - Ambulatory referral to Cardiology   [facility] [place] Ph[facility]ed Asad A. Faylene KurtzShah [facility] 08/21/2016 8:54 AM

## 2016-09-09 ENCOUNTER — Ambulatory Visit (INDEPENDENT_AMBULATORY_CARE_PROVIDER_SITE_OTHER): Payer: BC Managed Care – PPO | Admitting: Family Medicine

## 2016-09-09 ENCOUNTER — Encounter: Payer: Self-pay | Admitting: Family Medicine

## 2016-09-09 DIAGNOSIS — K4090 Unilateral inguinal hernia, without obstruction or gangrene, not specified as recurrent: Secondary | ICD-10-CM | POA: Diagnosis not present

## 2016-09-09 NOTE — Progress Notes (Signed)
Name: Jared HartshornDelbert M Harmon   MRN: [id number]    DOB: 10-26-1950   Date:09/09/2016       Progress Note  Subjective  Chief Complaint  Chief Complaint  Patient presents with  . Inguinal Hernia    right side. Pt has one in 2005 but on his left side    HPI  Right Groin Bulge: Pt. Presents for evaluation of a bulge in his right groin, noticed it 2 weeks ago, presents as a painless bulge in his right groin especially when picking up something heavier or bending over. Pt. Describes it as having a 'warm cooling sensation to it' He had an inguinal hernia n the left groin in 2005 and had surgery for repair.     Past Medical History:  Diagnosis Date  . Allergy   . GERD (gastroesophageal reflux disease)   . Hypertension     Past Surgical History:  Procedure Laterality Date  . HERNIA REPAIR      Family History  Problem Relation Age of Onset  . Stroke Father     Social History   Social History  . Marital status: Married    Spouse name: N/A  . Number of children: N/A  . Years of education: N/A   Occupational History  . Not on file.   Social History Main Topics  . Smoking status: Current Every Day Smoker  . Smokeless tobacco: Not on file  . Alcohol use Not on file  . Drug use: Unknown  . Sexual activity: Not on file   Other Topics Concern  . Not on file   Social History Narrative  . No narrative on file     Current Outpatient Prescriptions:  .  aspirin 81 MG tablet, Take 81 mg by mouth daily., Disp: , Rfl:  .  atorvastatin (LIPITOR) 40 MG tablet, Take 1 tablet (40 mg total) by mouth at bedtime., Disp: 90 tablet, Rfl: 1 .  atorvastatin (LIPITOR) 40 MG tablet, TAKE 1 TABLET (40 MG TOTAL) BY MOUTH AT BEDTIME., Disp: 90 tablet, Rfl: 0 .  carvedilol (COREG) 12.5 MG tablet, Take 1 tablet (12.5 mg total) by mouth daily., Disp: 90 tablet, Rfl: 1 .  [facility] VITAMIN B12 1000 MCG tablet, Take 1,000 mcg by mouth daily. Reported on 02/20/2016, Disp: , Rfl: 2 .  gabapentin (NEURONTIN)  300 MG capsule, Take 300 mg by mouth 3 (three) times daily., Disp: , Rfl: 6 .  triamterene-hydrochlorothiazide (MAXZIDE) 75-50 MG tablet, Take 1 tablet by mouth daily., Disp: 90 tablet, Rfl: 1  No Known Allergies   Review of Systems  Constitutional: Negative for chills, fever and malaise/fatigue.  Cardiovascular: Negative for chest pain.  Gastrointestinal: Negative for abdominal pain, blood in stool and constipation.  Genitourinary: Negative for dysuria and hematuria.      Objective  Vitals:   09/09/16 1535  BP: (!) 148/80  Pulse: 95  Resp: 16  Temp: 98 F (36.7 C)  SpO2: 97%  Weight: 164 lb 8 oz (74.6 kg)  Height: 5\' 10"  (1.778 m)    Physical Exam  Constitutional: He is well-developed, well-nourished, and in no distress.  Abdominal: A hernia is present. Hernia confirmed positive in the right inguinal area.    Nursing note and vitals reviewed.    Assessment & Plan  1. Inguinal hernia of right side without obstruction or gangrene Referral to Gen. surgery or management, no symptoms of incarceration. - Ambulatory referral to General Surgery   Nazeer Romney Asad A. Faylene KurtzShah [facility] 09/09/2016 3:57 PM

## 2016-09-16 ENCOUNTER — Encounter: Payer: Self-pay | Admitting: Cardiology

## 2016-09-16 ENCOUNTER — Ambulatory Visit (INDEPENDENT_AMBULATORY_CARE_PROVIDER_SITE_OTHER): Payer: BC Managed Care – PPO | Admitting: Cardiology

## 2016-09-16 VITALS — BP 150/84 | HR 70 | Ht 71.0 in | Wt 165.5 lb

## 2016-09-16 DIAGNOSIS — Z136 Encounter for screening for cardiovascular disorders: Secondary | ICD-10-CM | POA: Diagnosis not present

## 2016-09-16 DIAGNOSIS — F17200 Nicotine dependence, unspecified, uncomplicated: Secondary | ICD-10-CM | POA: Diagnosis not present

## 2016-09-16 DIAGNOSIS — R011 Cardiac murmur, unspecified: Secondary | ICD-10-CM

## 2016-09-16 DIAGNOSIS — I10 Essential (primary) hypertension: Secondary | ICD-10-CM

## 2016-09-16 NOTE — Patient Instructions (Signed)
Testing/Procedures: Your physician has requested that you have an echocardiogram. Echocardiography is a painless test that uses sound waves to create images of your heart. It provides your doctor with information about the size and shape of your heart and how well your heart's chambers and valves are working. This procedure takes approximately one hour. There are no restrictions for this procedure.    Follow-Up: Your physician recommends that you schedule a follow-up appointment as needed with Dr. Alvino ChapelIngal.  It was a pleasure seeing you today here in the office. Please do not hesitate to give us a call back if you have any further questions. [phone]  [place] CellarPamela A. RN, BSN

## 2016-09-16 NOTE — Progress Notes (Signed)
Cardiology Office Note   Date:  09/16/2016   ID:  Jared Harmon, DOB 09-09-1951, MRN [id number]  Referring Doctor:  Brayton ElSyed Shah, MD   Cardiologist:   Almond LintAileen Darrnell Mangiaracina, MD   Reason for consultation:  Chief Complaint  Patient presents with  . other    New Patient. Referred by Dr.Shah for cardiac murmur 12/7. Pt states he has been feeling well. Reviewed meds with pt verbally.      History of Present Illness: Jared Harmon is a 66 y.o. male who presents for Evaluation of cardiac murmur. This was noted by PCP on a recent visit.  Patient does not volunteer any symptoms. He denies chest pain, chest discomfort, shortness of breath, palpitations. He works as a Data processing managerschool custodian and does not have any issues with chest pain or breath with exertion during his work hours. He does not usually take the stairs but there are rales all over the school. No loss of consciousness.   ROS:  Please see the history of present illness. Aside from mentioned under HPI, all other systems are reviewed and negative.     Past Medical History:  Diagnosis Date  . Allergy   . GERD (gastroesophageal reflux disease)   . Hypertension     Past Surgical History:  Procedure Laterality Date  . HERNIA REPAIR       reports that he has been smoking.  He has been smoking about 1.50 packs per day. He has never used smokeless tobacco. He reports that he does not drink alcohol or use drugs.   family history includes Stroke in his father.   Outpatient Medications Prior to Visit  Medication Sig Dispense Refill  . aspirin 81 MG tablet Take 81 mg by mouth daily.    Marland Kitchen. atorvastatin (LIPITOR) 40 MG tablet Take 1 tablet (40 mg total) by mouth at bedtime. 90 tablet 1  . carvedilol (COREG) 12.5 MG tablet Take 1 tablet (12.5 mg total) by mouth daily. 90 tablet 1  . [facility] VITAMIN B12 1000 MCG tablet Take 1,000 mcg by mouth daily as needed. Reported on 02/20/2016  2  . gabapentin (NEURONTIN) 300 MG capsule Take 300 mg by mouth  3 (three) times daily.  6  . triamterene-hydrochlorothiazide (MAXZIDE) 75-50 MG tablet Take 1 tablet by mouth daily. 90 tablet 1  . atorvastatin (LIPITOR) 40 MG tablet TAKE 1 TABLET (40 MG TOTAL) BY MOUTH AT BEDTIME. (Patient not taking: Reported on 09/16/2016) 90 tablet 0   No facility-administered medications prior to visit.      Allergies: Patient has no known allergies.    PHYSICAL EXAM: VS:  BP (!) 150/84 (BP Location: Right Arm, Patient Position: Sitting, Cuff Size: Normal)   Pulse 70   Ht 5\' 11"  (1.803 m)   Wt 165 lb 8 oz (75.1 kg)   BMI 23.08 kg/m  , Body mass index is 23.08 kg/m. Wt Readings from Last 3 Encounters:  09/16/16 165 lb 8 oz (75.1 kg)  09/09/16 164 lb 8 oz (74.6 kg)  08/21/16 165 lb 1.6 oz (74.9 kg)    GENERAL:  well developed, well nourished, not in acute distress HEENT: normocephalic, pink conjunctivae, anicteric sclerae, no xanthelasma, normal dentition, oropharynx clear NECK:  no neck vein engorgement, JVP normal, no hepatojugular reflux, carotid upstroke brisk and symmetric, no bruit, no thyromegaly, no lymphadenopathy LUNGS:  good respiratory effort, clear to auscultation bilaterally CV:  PMI not displaced, no thrills, no lifts, S1 and S2 within normal limits, no palpable S3  or S4, Soft systolic murmur, no rubs, no gallops ABD:  Soft, nontender, nondistended, normoactive bowel sounds, no abdominal aortic bruit, no hepatomegaly, no splenomegaly MS: nontender back, no kyphosis, no scoliosis, no joint deformities EXT:  2+ DP/PT pulses, no edema, no varicosities, no cyanosis, no clubbing SKIN: warm, nondiaphoretic, normal turgor, no ulcers NEUROPSYCH: alert, oriented to person, place, and time, sensory/motor grossly intact, normal mood, appropriate affect  Recent Labs: 02/20/2016: ALT 10; BUN 10; Creatinine, Ser 0.86; Potassium 4.9; Sodium 137   Lipid Panel    Component Value Date/Time   CHOL 159 02/20/2016 1006   TRIG 71 02/20/2016 1006   HDL 40  02/20/2016 1006   CHOLHDL 4.0 02/20/2016 1006   LDLCALC 105 (H) 02/20/2016 1006     Other studies Reviewed:  EKG:  The ekg from 09/16/2016 was personally reviewed by me and it revealed sinus rhythm, sinus arrhythmia, 70 BPM. Artifact noted.  Additional studies/ records that were reviewed personally reviewed by me today include: None available   ASSESSMENT AND PLAN:  Systolic murmur Recommend echocardiogram for further evaluation  Hypertension Recommend light pressure monitoring at home. He has not been doing this although they have a blood pressure monitor at home. Goal is less than 130/80. Agree with aspirin 81 mg by mouth daily.  Tobacco use We discussed the importance of smoking cessation and different strategies for quitting.    Current medicines are reviewed at length with the patient today.  The patient does not have concerns regarding medicines.  Labs/ tests ordered today include:  Orders Placed This Encounter  Procedures  . EKG 12-Lead  . ECHOCARDIOGRAM COMPLETE    I had a lengthy and detailed discussion with the patient regarding diagnoses, prognosis, diagnostic options, treatment options, and side effects of medications.   I counseled the patient on importance of lifestyle modification including heart healthy diet, regular physical activity , and smoking cessation.   Disposition:   FU with undersigned after tests    Signed, Almond Lint, MD  09/16/2016 10:05 AM    [facility]  This note was generated in part with voice recognition software and I apologize for any typographical errors that were not detected and corrected.

## 2016-09-22 ENCOUNTER — Encounter: Payer: Self-pay | Admitting: *Deleted

## 2016-09-23 ENCOUNTER — Other Ambulatory Visit: Payer: BC Managed Care – PPO

## 2016-09-25 ENCOUNTER — Encounter: Payer: Self-pay | Admitting: General Surgery

## 2016-09-25 ENCOUNTER — Ambulatory Visit (INDEPENDENT_AMBULATORY_CARE_PROVIDER_SITE_OTHER): Payer: BC Managed Care – PPO | Admitting: General Surgery

## 2016-09-25 VITALS — BP 158/80 | HR 72 | Resp 14 | Ht 70.0 in | Wt 165.0 lb

## 2016-09-25 DIAGNOSIS — K4090 Unilateral inguinal hernia, without obstruction or gangrene, not specified as recurrent: Secondary | ICD-10-CM | POA: Diagnosis not present

## 2016-09-25 DIAGNOSIS — Z1211 Encounter for screening for malignant neoplasm of colon: Secondary | ICD-10-CM | POA: Diagnosis not present

## 2016-09-25 MED ORDER — POLYETHYLENE GLYCOL 3350 17 GM/SCOOP PO POWD: ORAL | 0 refills | Status: DC

## 2016-09-25 NOTE — Progress Notes (Signed)
Patient ID: Jared Harmon, male   DOB: Jan 21, 1951, 66 y.o.   MRN: [id number]  Chief Complaint  Patient presents with  . Hernia    HPI Jared Harmon is a 66 y.o. male.  Patient here today for an evaluation of a possible hernia.  He states that he has noticed it 08-27-16. He was lifting wet ceiling tiles to a bag while at work at [employer]. It does seem to be causing some lower abdominal pain described as a "burning".  No nausea, vomiting, constipation or diarrhea noted.Its about the size of a golf ball.  He is here today with his wife, Phillips HayJean Morace. I have reviewed the history of present illness with the patient.   HPI  Past Medical History:  Diagnosis Date  . GERD (gastroesophageal reflux disease)   . Hyperlipidemia   . Hypertension     Past Surgical History:  Procedure Laterality Date  . HERNIA REPAIR Left 2005    Family History  Problem Relation Age of Onset  . Stroke Father     Social History Social History  Substance Use Topics  . Smoking status: Current Every Day Smoker    Packs/day: 1.50  . Smokeless tobacco: Never Used  . Alcohol use No    No Known Allergies  Current Outpatient Prescriptions  Medication Sig Dispense Refill  . acetaminophen (TYLENOL) 500 MG tablet Take 500 mg by mouth every 6 (six) hours as needed.    Marland Kitchen. aspirin 81 MG tablet Take 81 mg by mouth daily.    Marland Kitchen. atorvastatin (LIPITOR) 40 MG tablet Take 1 tablet (40 mg total) by mouth at bedtime. 90 tablet 1  . carvedilol (COREG) 12.5 MG tablet Take 1 tablet (12.5 mg total) by mouth daily. 90 tablet 1  . [facility] VITAMIN B12 1000 MCG tablet Take 1,000 mcg by mouth daily as needed. Reported on 02/20/2016  2  . gabapentin (NEURONTIN) 300 MG capsule Take 300 mg by mouth 3 (three) times daily.  6  . triamterene-hydrochlorothiazide (MAXZIDE) 75-50 MG tablet Take 1 tablet by mouth daily. 90 tablet 1  . polyethylene glycol powder (GLYCOLAX/MIRALAX) powder 255 grams one bottle for colonoscopy prep 255 g  0   No current facility-administered medications for this visit.     Review of Systems Review of Systems  Constitutional: Negative.   Respiratory: Negative.   Cardiovascular: Negative.     Blood pressure (!) 158/80, pulse 72, resp. rate 14, height 5\' 10"  (1.778 m), weight 165 lb (74.8 kg).  Physical Exam Physical Exam  Constitutional: He is oriented to person, place, and time. He appears well-developed and well-nourished.  Eyes: Conjunctivae are normal. No scleral icterus.  Neck: Neck supple.  Cardiovascular: Normal rate, regular rhythm and normal heart sounds.   Pulmonary/Chest: Effort normal and breath sounds normal.  Abdominal: Soft. Normal appearance and bowel sounds are normal. There is no hepatomegaly. There is no tenderness.  No obvious hernia noted on inspection and palpation of right inguinal area. There is a mild fullness in right groin when he strains or coughs. By history he occasionally feels a small knot there     Lymphadenopathy:    He has no cervical adenopathy.  Neurological: He is alert and oriented to person, place, and time.  Skin: Skin is warm and dry.  Psychiatric: His behavior is normal.    Data Reviewed Prior notes  Assessment    Right inguinal hernia by history, not noted on exam today. This can be followed. Pt advised. Will  recheck in 2mos. Also noted he has not had a colonoscopy- he has been putting it off. He is however willing to have it done now.    Plan    Colonoscopy with possible biopsy/polypectomy prn: Information regarding the procedure, including its potential risks and complications (including but not limited to perforation of the bowel, which may require emergency surgery to repair, and bleeding) was verbally given to the patient. Educational information regarding lower intestinal endoscopy was given to the patient. Written instructions for how to complete the bowel prep using Miralax were provided. The importance of drinking ample  fluids to avoid dehydration as a result of the prep emphasized.  Patient has been scheduled for a colonoscopy on 09-30-16 at [facility]. It is okay for patient to continue 81 mg aspirin once daily.     Patient to return in 2 months for reassessment of hernia   This information has been scribed by Ples Specter CMA.    Tiffine Henigan G 09/25/2016, 12:08 PM

## 2016-09-25 NOTE — Patient Instructions (Addendum)
Patient to return in three months Colonoscopy, Adult A colonoscopy is an exam to look at the entire large intestine. During the exam, a lubricated, bendable tube is inserted into the anus and then passed into the rectum, colon, and other parts of the large intestine. A colonoscopy is often done as a part of normal colorectal screening or in response to certain symptoms, such as anemia, persistent diarrhea, abdominal pain, and blood in the stool. The exam can help screen for and diagnose medical problems, including:  Tumors.  Polyps.  Inflammation.  Areas of bleeding. Tell a health care provider about:  Any allergies you have.  All medicines you are taking, including vitamins, herbs, eye drops, creams, and over-the-counter medicines.  Any problems you or family members have had with anesthetic medicines.  Any blood disorders you have.  Any surgeries you have had.  Any medical conditions you have.  Any problems you have had passing stool. What are the risks? Generally, this is a safe procedure. However, problems may occur, including:  Bleeding.  A tear in the intestine.  A reaction to medicines given during the exam.  Infection (rare). What happens before the procedure? Eating and drinking restrictions  Follow instructions from your health care provider about eating and drinking, which may include:  A few days before the procedure - follow a low-fiber diet. Avoid nuts, seeds, dried fruit, raw fruits, and vegetables.  1-3 days before the procedure - follow a clear liquid diet. Drink only clear liquids, such as clear broth or bouillon, black coffee or tea, clear juice, clear soft drinks or sports drinks, gelatin desert, and popsicles. Avoid any liquids that contain red or purple dye.  On the day of the procedure - do not eat or drink anything during the 2 hours before the procedure, or within the time period that your health care provider recommends. Bowel prep  If you were  prescribed an oral bowel prep to clean out your colon:  Take it as told by your health care provider. Starting the day before your procedure, you will need to drink a large amount of medicated liquid. The liquid will cause you to have multiple loose stools until your stool is almost clear or light green.  If your skin or anus gets irritated from diarrhea, you may use these to relieve the irritation:  Medicated wipes, such as adult wet wipes with aloe and vitamin E.  A skin soothing-product like petroleum jelly.  If you vomit while drinking the bowel prep, take a break for up to 60 minutes and then begin the bowel prep again. If vomiting continues and you cannot take the bowel prep without vomiting, call your health care provider. General instructions  Ask your health care provider about changing or stopping your regular medicines. This is especially important if you are taking diabetes medicines or blood thinners.  Plan to have someone take you home from the hospital or clinic. What happens during the procedure?  An IV tube may be inserted into one of your veins.  You will be given medicine to help you relax (sedative).  To reduce your risk of infection:  Your health care team will wash or sanitize their hands.  Your anal area will be washed with soap.  You will be asked to lie on your side with your knees bent.  Your health care provider will lubricate a long, thin, flexible tube. The tube will have a camera and a light on the end.  The tube will  be inserted into your anus.  The tube will be gently eased through your rectum and colon.  Air will be delivered into your colon to keep it open. You may feel some pressure or cramping.  The camera will be used to take images during the procedure.  A small tissue sample may be removed from your body to be examined under a microscope (biopsy). If any potential problems are found, the tissue will be sent to a lab for testing.  If  small polyps are found, your health care provider may remove them and have them checked for cancer cells.  The tube that was inserted into your anus will be slowly removed. The procedure may vary among health care providers and hospitals. What happens after the procedure?  Your blood pressure, heart rate, breathing rate, and blood oxygen level will be monitored until the medicines you were given have worn off.  Do not drive for 24 hours after the exam.  You may have a small amount of blood in your stool.  You may pass gas and have mild abdominal cramping or bloating due to the air that was used to inflate your colon during the exam.  It is up to you to get the results of your procedure. Ask your health care provider, or the department performing the procedure, when your results will be ready. This information is not intended to replace advice given to you by your health care provider. Make sure you discuss any questions you have with your health care provider. Document Released: 08/29/2000 Document Revised: 03/21/2016 Document Reviewed: 11/13/2015 Elsevier Interactive Patient Education  2017 [employer].  The patient is aware to call back for any questions or concerns.

## 2016-09-29 ENCOUNTER — Encounter: Payer: Self-pay | Admitting: *Deleted

## 2016-09-30 ENCOUNTER — Ambulatory Visit: Payer: BC Managed Care – PPO | Admitting: Anesthesiology

## 2016-09-30 ENCOUNTER — Encounter: Payer: Self-pay | Admitting: *Deleted

## 2016-09-30 ENCOUNTER — Ambulatory Visit
Admission: RE | Admit: 2016-09-30 | Discharge: 2016-09-30 | Disposition: A | Discharge status: Home / Self Care | Payer: BC Managed Care – PPO | Source: Ambulatory Visit | Attending: General Surgery | Admitting: General Surgery

## 2016-09-30 ENCOUNTER — Encounter
Admission: RE | Disposition: A | Discharge status: Home / Self Care | Payer: Self-pay | Source: Ambulatory Visit | Attending: General Surgery

## 2016-09-30 DIAGNOSIS — Z79899 Other long term (current) drug therapy: Secondary | ICD-10-CM | POA: Insufficient documentation

## 2016-09-30 DIAGNOSIS — Z7982 Long term (current) use of aspirin: Secondary | ICD-10-CM | POA: Diagnosis not present

## 2016-09-30 DIAGNOSIS — J449 Chronic obstructive pulmonary disease, unspecified: Secondary | ICD-10-CM | POA: Insufficient documentation

## 2016-09-30 DIAGNOSIS — I10 Essential (primary) hypertension: Secondary | ICD-10-CM | POA: Insufficient documentation

## 2016-09-30 DIAGNOSIS — Z1211 Encounter for screening for malignant neoplasm of colon: Secondary | ICD-10-CM | POA: Diagnosis not present

## 2016-09-30 DIAGNOSIS — F17210 Nicotine dependence, cigarettes, uncomplicated: Secondary | ICD-10-CM | POA: Diagnosis not present

## 2016-09-30 DIAGNOSIS — K5730 Diverticulosis of large intestine without perforation or abscess without bleeding: Secondary | ICD-10-CM | POA: Diagnosis not present

## 2016-09-30 DIAGNOSIS — K219 Gastro-esophageal reflux disease without esophagitis: Secondary | ICD-10-CM | POA: Diagnosis not present

## 2016-09-30 DIAGNOSIS — E785 Hyperlipidemia, unspecified: Secondary | ICD-10-CM | POA: Diagnosis not present

## 2016-09-30 DIAGNOSIS — M1990 Unspecified osteoarthritis, unspecified site: Secondary | ICD-10-CM | POA: Diagnosis not present

## 2016-09-30 HISTORY — PX: COLONOSCOPY WITH PROPOFOL: SHX5780

## 2016-09-30 HISTORY — DX: Gastro-esophageal reflux disease without esophagitis: K21.9

## 2016-09-30 SURGERY — COLONOSCOPY WITH PROPOFOL
Anesthesia: General

## 2016-09-30 MED ORDER — LIDOCAINE HCL (PF) 1 % IJ SOLN
INTRAMUSCULAR | Status: AC
  Administered 2016-09-30: 0.03 mL via INTRADERMAL
  Filled 2016-09-30: qty 2

## 2016-09-30 MED ORDER — SODIUM CHLORIDE 0.9 % IV SOLN
INTRAVENOUS | Status: DC
Start: 2016-09-30 — End: 2016-09-30
  Administered 2016-09-30: 12:00:00 via INTRAVENOUS
  Administered 2016-09-30: 1000 mL via INTRAVENOUS

## 2016-09-30 MED ORDER — PROPOFOL 500 MG/50ML IV EMUL
INTRAVENOUS | Status: DC | PRN
  Administered 2016-09-30: 150 ug/kg/min via INTRAVENOUS

## 2016-09-30 MED ORDER — PROPOFOL 500 MG/50ML IV EMUL
INTRAVENOUS | Status: AC
  Filled 2016-09-30: qty 50

## 2016-09-30 MED ORDER — PROPOFOL 10 MG/ML IV BOLUS
INTRAVENOUS | Status: DC | PRN
  Administered 2016-09-30: 60 mg via INTRAVENOUS

## 2016-09-30 MED ORDER — LIDOCAINE HCL (PF) 1 % IJ SOLN
2.0000 mL | Freq: Once | INTRAMUSCULAR | Status: AC
  Administered 2016-09-30: 0.03 mL via INTRADERMAL

## 2016-09-30 MED ORDER — PHENYLEPHRINE HCL 10 MG/ML IJ SOLN
INTRAMUSCULAR | Status: DC | PRN
  Administered 2016-09-30 (×8): 100 ug via INTRAVENOUS

## 2016-09-30 NOTE — Anesthesia Procedure Notes (Signed)
Date/Time: 09/30/2016 11:56 AM Performed by: Junious SilkNOLES, Olimpia Tinch Pre-anesthesia Checklist: Patient identified, Emergency Drugs available, Suction available, Patient being monitored and Timeout performed Oxygen Delivery Method: Nasal cannula

## 2016-09-30 NOTE — Anesthesia Postprocedure Evaluation (Signed)
Anesthesia Post Note  Patient: Jared Harmon  Procedure(s) Performed: Procedure(s) (LRB): COLONOSCOPY WITH PROPOFOL (N/A)  Patient location during evaluation: Endoscopy Anesthesia Type: General Level of consciousness: awake and alert Pain management: pain level controlled Vital Signs Assessment: post-procedure vital signs reviewed and stable Respiratory status: spontaneous breathing and respiratory function stable Cardiovascular status: stable Anesthetic complications: no     Last Vitals:  Vitals:   09/30/16 1114 09/30/16 1225  BP: 99/66 (!) 145/85  Pulse: (!) 105 83  Resp: 17 16  Temp: (!) 35.8 C 36.2 C    Last Pain:  Vitals:   09/30/16 1225  TempSrc: Tympanic                 KEPHART,WILLIAM K

## 2016-09-30 NOTE — Op Note (Signed)
[facility] Gastroenterology Patient Name: Imagene ShellerDelbert Dillin Procedure Date: 09/30/2016 11:44 AM MRN: [id number] Account #: [id number] Date of Birth: Apr 13, 1951 Admit Type: Outpatient Age: [age] Room: [facility] ENDO ROOM 1 Gender: Male Note Status: Finalized Procedure:            Colonoscopy Indications:          Screening for colorectal malignant neoplasm Providers:            Urvi Imes G. Evette CristalSankar, MD Referring MD:         Shelbie AmmonsSyed A. Sherryll BurgerShah (Referring MD) Medicines:            General Anesthesia Complications:        No immediate complications. Procedure:            Pre-Anesthesia Assessment:                       - General anesthesia under the supervision of an                        anesthesiologist was determined to be medically                        necessary for this procedure based on review of the                        patient's medical history, medications, and prior                        anesthesia history.                       After obtaining informed consent, the colonoscope was                        passed under direct vision. Throughout the procedure,                        the patient's blood pressure, pulse, and oxygen                        saturations were monitored continuously. The                        Colonoscope was introduced through the anus and                        advanced to the the cecum, identified by the ileocecal                        valve. The colonoscopy was performed without                        difficulty. The patient tolerated the procedure well.                        The quality of the bowel preparation was good. Findings:      The perianal and digital rectal examinations were normal.      A few small-mouthed diverticula were found in the sigmoid colon.      The exam was otherwise without abnormality on direct and retroflexion  views. Impression:           - Diverticulosis in the sigmoid colon.                       -  The examination was otherwise normal on direct and                        retroflexion views.                       - No specimens collected. Recommendation:       - Discharge patient to home.                       - Resume previous diet.                       - Continue present medications.                       - Return to my office as previously scheduled. Procedure Code(s):    --- Professional ---                       [phone], Colonoscopy, flexible; diagnostic, including                        collection of specimen(s) by brushing or washing, when                        performed (separate procedure) Diagnosis Code(s):    --- Professional ---                       Z12.11, Encounter for screening for malignant neoplasm                        of colon                       K57.30, Diverticulosis of large intestine without                        perforation or abscess without bleeding CPT copyright 2016 American Medical Association. All rights reserved. The codes documented in this report are preliminary and upon coder review may  be revised to meet current compliance requirements. Kieth Brightly, MD 09/30/2016 12:22:50 PM This report has been signed electronically. Number of Addenda: 0 Note Initiated On: 09/30/2016 11:44 AM Scope Withdrawal Time: 0 hours 11 minutes 55 seconds  Total Procedure Duration: 0 hours 25 minutes 28 seconds       [facility]

## 2016-09-30 NOTE — Anesthesia Post-op Follow-up Note (Cosign Needed)
Anesthesia QCDR form completed.        

## 2016-09-30 NOTE — Anesthesia Preprocedure Evaluation (Addendum)
Anesthesia Evaluation  Patient identified by MRN, date of birth, ID band Patient awake    Reviewed: Allergy & Precautions, H&P , NPO status , Patient's Chart, lab work & pertinent test results  History of Anesthesia Complications Negative for: history of anesthetic complications  Airway Mallampati: III  TM Distance: <3 FB Neck ROM: limited    Dental  (+) Poor Dentition, Chipped, Missing, Loose   Pulmonary neg shortness of breath, COPD, Current Smoker,    Pulmonary exam normal breath sounds clear to auscultation       Cardiovascular Exercise Tolerance: Good hypertension, (-) angina(-) Past MI and (-) DOE + Valvular Problems/Murmurs  Rhythm:regular Rate:Normal + Systolic murmurs    Neuro/Psych Seizures -,   Neuromuscular disease negative psych ROS   GI/Hepatic negative GI ROS, Neg liver ROS, GERD  Controlled,  Endo/Other  negative endocrine ROS  Renal/GU negative Renal ROS  negative genitourinary   Musculoskeletal  (+) Arthritis ,   Abdominal   Peds  Hematology negative hematology ROS (+)   Anesthesia Other Findings Past Medical History: No date: Acid reflux No date: GERD (gastroesophageal reflux disease) No date: Hyperlipidemia No date: Hypertension  Past Surgical History: 2005: HERNIA REPAIR Left No date: VASCULAR SURGERY  BMI    Body Mass Index:  23.96 kg/m      Reproductive/Obstetrics negative OB ROS                            Anesthesia Physical Anesthesia Plan  ASA: III  Anesthesia Plan: General   Post-op Pain Management:    Induction:   Airway Management Planned:   Additional Equipment:   Intra-op Plan:   Post-operative Plan:   Informed Consent: I have reviewed the patients History and Physical, chart, labs and discussed the procedure including the risks, benefits and alternatives for the proposed anesthesia with the patient or authorized representative who  has indicated his/her understanding and acceptance.   Dental Advisory Given  Plan Discussed with: Anesthesiologist, CRNA and Surgeon  Anesthesia Plan Comments:         Anesthesia Quick Evaluation

## 2016-09-30 NOTE — Transfer of Care (Signed)
Immediate Anesthesia Transfer of Care Note  Patient: Jared Harmon  Procedure(s) Performed: Procedure(s): COLONOSCOPY WITH PROPOFOL (N/A)  Patient Location: PACU  Anesthesia Type:General  Level of Consciousness: sedated  Airway & Oxygen Therapy: Patient Spontanous Breathing and Patient connected to nasal cannula oxygen  Post-op Assessment: Report given to RN and Post -op Vital signs reviewed and stable  Post vital signs: Reviewed and stable  Last Vitals:  Vitals:   09/30/16 1114 09/30/16 1225  BP: 99/66 (!) 145/85  Pulse: (!) 105 83  Resp: 17 16  Temp: (!) 35.8 C 36.2 C    Last Pain:  Vitals:   09/30/16 1225  TempSrc: Tympanic         Complications: No apparent anesthesia complications

## 2016-09-30 NOTE — H&P (View-Only) (Signed)
Patient ID: Jared HartshornDelbert M Harmon, male   DOB: Jan 21, 1951, 66 y.o.   MRN: [id number]  Chief Complaint  Patient presents with  . Hernia    HPI Jared Harmon is a 66 y.o. male.  Patient here today for an evaluation of a possible hernia.  He states that he has noticed it 08-27-16. He was lifting wet ceiling tiles to a bag while at work at [employer]. It does seem to be causing some lower abdominal pain described as a "burning".  No nausea, vomiting, constipation or diarrhea noted.Its about the size of a golf ball.  He is here today with his wife, Jared Harmon. I have reviewed the history of present illness with the patient.   HPI  Past Medical History:  Diagnosis Date  . GERD (gastroesophageal reflux disease)   . Hyperlipidemia   . Hypertension     Past Surgical History:  Procedure Laterality Date  . HERNIA REPAIR Left 2005    Family History  Problem Relation Age of Onset  . Stroke Father     Social History Social History  Substance Use Topics  . Smoking status: Current Every Day Smoker    Packs/day: 1.50  . Smokeless tobacco: Never Used  . Alcohol use No    No Known Allergies  Current Outpatient Prescriptions  Medication Sig Dispense Refill  . acetaminophen (TYLENOL) 500 MG tablet Take 500 mg by mouth every 6 (six) hours as needed.    Marland Kitchen. aspirin 81 MG tablet Take 81 mg by mouth daily.    Marland Kitchen. atorvastatin (LIPITOR) 40 MG tablet Take 1 tablet (40 mg total) by mouth at bedtime. 90 tablet 1  . carvedilol (COREG) 12.5 MG tablet Take 1 tablet (12.5 mg total) by mouth daily. 90 tablet 1  . [facility] VITAMIN B12 1000 MCG tablet Take 1,000 mcg by mouth daily as needed. Reported on 02/20/2016  2  . gabapentin (NEURONTIN) 300 MG capsule Take 300 mg by mouth 3 (three) times daily.  6  . triamterene-hydrochlorothiazide (MAXZIDE) 75-50 MG tablet Take 1 tablet by mouth daily. 90 tablet 1  . polyethylene glycol powder (GLYCOLAX/MIRALAX) powder 255 grams one bottle for colonoscopy prep 255 g  0   No current facility-administered medications for this visit.     Review of Systems Review of Systems  Constitutional: Negative.   Respiratory: Negative.   Cardiovascular: Negative.     Blood pressure (!) 158/80, pulse 72, resp. rate 14, height 5\' 10"  (1.778 m), weight 165 lb (74.8 kg).  Physical Exam Physical Exam  Constitutional: He is oriented to person, place, and time. He appears well-developed and well-nourished.  Eyes: Conjunctivae are normal. No scleral icterus.  Neck: Neck supple.  Cardiovascular: Normal rate, regular rhythm and normal heart sounds.   Pulmonary/Chest: Effort normal and breath sounds normal.  Abdominal: Soft. Normal appearance and bowel sounds are normal. There is no hepatomegaly. There is no tenderness.  No obvious hernia noted on inspection and palpation of right inguinal area. There is a mild fullness in right groin when he strains or coughs. By history he occasionally feels a small knot there     Lymphadenopathy:    He has no cervical adenopathy.  Neurological: He is alert and oriented to person, place, and time.  Skin: Skin is warm and dry.  Psychiatric: His behavior is normal.    Data Reviewed Prior notes  Assessment    Right inguinal hernia by history, not noted on exam today. This can be followed. Pt advised. Will  recheck in 2mos. Also noted he has not had a colonoscopy- he has been putting it off. He is however willing to have it done now.    Plan    Colonoscopy with possible biopsy/polypectomy prn: Information regarding the procedure, including its potential risks and complications (including but not limited to perforation of the bowel, which may require emergency surgery to repair, and bleeding) was verbally given to the patient. Educational information regarding lower intestinal endoscopy was given to the patient. Written instructions for how to complete the bowel prep using Miralax were provided. The importance of drinking ample  fluids to avoid dehydration as a result of the prep emphasized.  Patient has been scheduled for a colonoscopy on 09-30-16 at [facility]. It is okay for patient to continue 81 mg aspirin once daily.     Patient to return in 2 months for reassessment of hernia   This information has been scribed by Ples Specter CMA.    Seini Lannom G 09/25/2016, 12:08 PM

## 2016-09-30 NOTE — Interval H&P Note (Signed)
History and Physical Interval Note:  09/30/2016 11:36 AM  Jared Harmon  has presented today for surgery, with the diagnosis of SCREENING  The various methods of treatment have been discussed with the patient and family. After consideration of risks, benefits and other options for treatment, the patient has consented to  Procedure(s): COLONOSCOPY WITH PROPOFOL (N/A) as a surgical intervention .  The patient's history has been reviewed, patient examined, no change in status, stable for surgery.  I have reviewed the patient's chart and labs.  Questions were answered to the patient's satisfaction.     Suren Payne G

## 2016-10-03 ENCOUNTER — Encounter: Payer: Self-pay | Admitting: General Surgery

## 2016-10-08 ENCOUNTER — Other Ambulatory Visit: Payer: BC Managed Care – PPO

## 2016-10-10 ENCOUNTER — Encounter: Payer: Self-pay | Admitting: Family Medicine

## 2016-10-10 ENCOUNTER — Other Ambulatory Visit: Payer: Self-pay | Admitting: Family Medicine

## 2016-10-10 ENCOUNTER — Ambulatory Visit (INDEPENDENT_AMBULATORY_CARE_PROVIDER_SITE_OTHER): Payer: BC Managed Care – PPO | Admitting: Family Medicine

## 2016-10-10 DIAGNOSIS — Z0000 Encounter for general adult medical examination without abnormal findings: Secondary | ICD-10-CM | POA: Diagnosis not present

## 2016-10-10 DIAGNOSIS — H6122 Impacted cerumen, left ear: Secondary | ICD-10-CM

## 2016-10-10 DIAGNOSIS — H6120 Impacted cerumen, unspecified ear: Secondary | ICD-10-CM | POA: Insufficient documentation

## 2016-10-10 LAB — CBC WITH DIFFERENTIAL/PLATELET
BASOS ABS: 0 {cells}/uL (ref 0–200)
Basophils Relative: 0 %
Eosinophils Absolute: 128 cells/uL (ref 15–500)
Eosinophils Relative: 2 %
HEMATOCRIT: 41.7 % (ref 38.5–50.0)
Hemoglobin: 14.2 g/dL (ref 13.2–17.1)
LYMPHS PCT: 24 %
Lymphs Abs: 1536 cells/uL (ref 850–3900)
MCH: 34.2 pg — AB (ref 27.0–33.0)
MCHC: 34.1 g/dL (ref 32.0–36.0)
MCV: 100.5 fL — ABNORMAL HIGH (ref 80.0–100.0)
MONO ABS: 384 {cells}/uL (ref 200–950)
MONOS PCT: 6 %
MPV: 8.8 fL (ref 7.5–12.5)
Neutro Abs: 4352 cells/uL (ref 1500–7800)
Neutrophils Relative %: 68 %
PLATELETS: 263 10*3/uL (ref 140–400)
RBC: 4.15 MIL/uL — ABNORMAL LOW (ref 4.20–5.80)
RDW: 12.7 % (ref 11.0–15.0)
WBC: 6.4 10*3/uL (ref 3.8–10.8)

## 2016-10-10 LAB — COMPLETE METABOLIC PANEL WITH GFR
ALBUMIN: 4.6 g/dL (ref 3.6–5.1)
ALK PHOS: 89 U/L (ref 40–115)
ALT: 15 U/L (ref 9–46)
AST: 17 U/L (ref 10–35)
BUN: 13 mg/dL (ref 7–25)
CO2: 33 mmol/L — AB (ref 20–31)
CREATININE: 0.89 mg/dL (ref 0.70–1.25)
Calcium: 9.8 mg/dL (ref 8.6–10.3)
Chloride: 97 mmol/L — ABNORMAL LOW (ref 98–110)
GFR, EST NON AFRICAN AMERICAN: 89 mL/min (ref >=60)
GFR, Est African American: >89 mL/min (ref >=60)
Glucose, Bld: 107 mg/dL — ABNORMAL HIGH (ref 65–99)
Potassium: 4.2 mmol/L (ref 3.5–5.3)
SODIUM: 136 mmol/L (ref 135–146)
TOTAL PROTEIN: 7.4 g/dL (ref 6.1–8.1)
Total Bilirubin: 0.6 mg/dL (ref 0.2–1.2)

## 2016-10-10 LAB — LIPID PANEL
Cholesterol: 118 mg/dL (ref <200)
HDL: 38 mg/dL — ABNORMAL LOW (ref >40)
LDL CALC: 60 mg/dL (ref <100)
Total CHOL/HDL Ratio: 3.1 Ratio (ref <5.0)
Triglycerides: 98 mg/dL (ref <150)
VLDL: 20 mg/dL (ref <30)

## 2016-10-10 LAB — PSA: PSA: 1.2 ng/mL (ref <=4.0)

## 2016-10-10 LAB — TSH: TSH: 1.19 mIU/L (ref 0.40–4.50)

## 2016-10-10 NOTE — Progress Notes (Signed)
Name: Jared HartshornDelbert M Sachs   MRN: [id number]    DOB: July 04, 1951   Date:10/10/2016       Progress Note  Subjective  Chief Complaint  Chief Complaint  Patient presents with  . Annual Exam    HPI  Pt. Presents for a Complete Physical Exam. Colonoscopy is completed January 2018. He is due for DRE and PSA.  Past Medical History:  Diagnosis Date  . Acid reflux   . GERD (gastroesophageal reflux disease)   . Hyperlipidemia   . Hypertension     Past Surgical History:  Procedure Laterality Date  . COLONOSCOPY WITH PROPOFOL N/A 09/30/2016   Procedure: COLONOSCOPY WITH PROPOFOL;  Surgeon: Kieth BrightlySeeplaputhur G Sankar, MD;  Location: [facility] ENDOSCOPY;  Service: Endoscopy;  Laterality: N/A;  . HERNIA REPAIR Left 2005  . VASCULAR SURGERY      Family History  Problem Relation Age of Onset  . Stroke Father     Social History   Social History  . Marital status: Married    Spouse name: N/A  . Number of children: N/A  . Years of education: N/A   Occupational History  . Not on file.   Social History Main Topics  . Smoking status: Current Every Day Smoker    Packs/day: 1.50  . Smokeless tobacco: Never Used  . Alcohol use No  . Drug use: No  . Sexual activity: Not on file   Other Topics Concern  . Not on file   Social History Narrative  . No narrative on file     Current Outpatient Prescriptions:  .  acetaminophen (TYLENOL) 500 MG tablet, Take 500 mg by mouth every 6 (six) hours as needed., Disp: , Rfl:  .  aspirin 81 MG tablet, Take 81 mg by mouth daily., Disp: , Rfl:  .  atorvastatin (LIPITOR) 40 MG tablet, Take 1 tablet (40 mg total) by mouth at bedtime., Disp: 90 tablet, Rfl: 1 .  carvedilol (COREG) 12.5 MG tablet, Take 1 tablet (12.5 mg total) by mouth daily., Disp: 90 tablet, Rfl: 1 .  [facility] VITAMIN B12 1000 MCG tablet, Take 1,000 mcg by mouth daily as needed. Reported on 02/20/2016, Disp: , Rfl: 2 .  gabapentin (NEURONTIN) 300 MG capsule, Take 300 mg by mouth 3 (three) times  daily., Disp: , Rfl: 6 .  polyethylene glycol powder (GLYCOLAX/MIRALAX) powder, 255 grams one bottle for colonoscopy prep, Disp: 255 g, Rfl: 0 .  triamterene-hydrochlorothiazide (MAXZIDE) 75-50 MG tablet, Take 1 tablet by mouth daily., Disp: 90 tablet, Rfl: 1  No Known Allergies   Review of Systems  Constitutional: Negative for chills, fever, malaise/fatigue and weight loss.  HENT: Positive for congestion and hearing loss (has noticed since December 28th, 2017 that he cannot hear very well out of the left ear). Negative for ear pain, sinus pain and sore throat.   Eyes: Negative for blurred vision and double vision.  Respiratory: Positive for cough (chronic intermittent cough) and wheezing. Negative for sputum production and shortness of breath.   Cardiovascular: Negative for chest pain, palpitations and leg swelling.  Gastrointestinal: Negative for abdominal pain, blood in stool, constipation, diarrhea, nausea and vomiting.  Genitourinary: Negative for dysuria and hematuria.  Musculoskeletal: Negative for back pain and neck pain.  Skin: Negative for itching and rash.  Neurological: Negative for headaches (feels like having pressure in his head, congestion).  Psychiatric/Behavioral: Negative for depression. The patient is not nervous/anxious and does not have insomnia.     Objective  Vitals:   10/10/16 [id number]  BP: 110/68  Pulse: 92  Resp: 18  Temp: 98.3 F (36.8 C)  TempSrc: Oral  SpO2: 95%  Weight: 158 lb 11.2 oz (72 kg)  Height: 5\' 10"  (1.778 m)    Physical Exam  Constitutional: He is oriented to person, place, and time and well-developed, well-nourished, and in no distress.  HENT:  Head: Normocephalic and atraumatic.  Mouth/Throat: Posterior oropharyngeal erythema present. No oropharyngeal exudate.  Left and right ear cerumen impaction,   Eyes: Pupils are equal, round, and reactive to light.  Cardiovascular: Normal rate, regular rhythm and normal heart sounds.   No murmur  heard. Pulmonary/Chest: Effort normal and breath sounds normal.  Abdominal: Soft. Bowel sounds are normal. There is no tenderness.  Genitourinary: Prostate normal. Prostate is not enlarged and not tender.  Musculoskeletal: He exhibits no edema.  Neurological: He is alert and oriented to person, place, and time.  Skin: Skin is warm and dry.  Psychiatric: Mood, memory, affect and judgment normal.  Nursing note and vitals reviewed.      Assessment & Plan  1. Annual physical exam  - CBC with Differential - PSA - TSH - Vitamin D (25 hydroxy)  2. Hearing loss of both ears due to cerumen impaction  - Ear Lavage    Syed Asad A. Faylene Kurtz Medical [facility] 10/10/2016 8:55 AM

## 2016-10-11 LAB — VITAMIN D 25 HYDROXY (VIT D DEFICIENCY, FRACTURES): VIT D 25 HYDROXY: 14 ng/mL — AB (ref 30–100)

## 2016-10-13 ENCOUNTER — Other Ambulatory Visit: Payer: Self-pay

## 2016-10-13 ENCOUNTER — Ambulatory Visit (INDEPENDENT_AMBULATORY_CARE_PROVIDER_SITE_OTHER): Payer: BC Managed Care – PPO

## 2016-10-13 DIAGNOSIS — R011 Cardiac murmur, unspecified: Secondary | ICD-10-CM

## 2016-10-22 ENCOUNTER — Telehealth: Payer: Self-pay | Admitting: Family Medicine

## 2016-10-22 NOTE — Telephone Encounter (Signed)
Requesting return call pertaining to lab results

## 2016-10-23 ENCOUNTER — Telehealth: Payer: Self-pay

## 2016-10-23 MED ORDER — VITAMIN D (ERGOCALCIFEROL) 1.25 MG (50000 UNIT) PO CAPS: 50000.0000 [IU] | ORAL_CAPSULE | ORAL | 0 refills | Status: DC

## 2016-10-23 NOTE — Telephone Encounter (Signed)
Patient has been notified of lab results  

## 2016-10-23 NOTE — Telephone Encounter (Signed)
Patient has been notified of lab results and a prescription for vitamin D3 50,000 units take 1 capsule once a week x12 weeks has been sent to [facility] per Dr. Sherryll BurgerShah, patient has been notified and verbalized understanding

## 2016-10-30 ENCOUNTER — Ambulatory Visit (INDEPENDENT_AMBULATORY_CARE_PROVIDER_SITE_OTHER): Payer: BC Managed Care – PPO | Admitting: General Surgery

## 2016-10-30 ENCOUNTER — Encounter: Payer: Self-pay | Admitting: General Surgery

## 2016-10-30 VITALS — BP 142/78 | HR 80 | Resp 14 | Ht 70.0 in | Wt 164.0 lb

## 2016-10-30 DIAGNOSIS — K4090 Unilateral inguinal hernia, without obstruction or gangrene, not specified as recurrent: Secondary | ICD-10-CM | POA: Diagnosis not present

## 2016-10-30 NOTE — Patient Instructions (Signed)
Inguinal Hernia, Adult Introduction An inguinal hernia is when fat or the intestines push through the area where the leg meets the lower abdomen (groin) and create a rounded lump (bulge). This condition develops over time. There are three types of inguinal hernias. These types include:  Hernias that can be pushed back into the belly (are reducible).  Hernias that are not reducible (are incarcerated).  Hernias that are not reducible and lose their blood supply (are strangulated). This type of hernia requires emergency surgery. What are the causes? This condition is caused by having a weak spot in the muscles or tissue. This weakness lets the hernia poke through. This condition can be triggered by:  Suddenly straining the muscles of the lower abdomen.  Lifting heavy objects.  Straining to have a bowel movement. Difficult bowel movements (constipation) can lead to this.  Coughing. What increases the risk? This condition is more likely to develop in:  Men.  Pregnant women.  People who:  Are overweight.  Work in jobs that require long periods of standing or heavy lifting.  Have had an inguinal hernia before.  Smoke or have lung disease. These factors can lead to long-lasting (chronic) coughing. What are the signs or symptoms? Symptoms can depend on the size of the hernia. Often, a small inguinal hernia has no symptoms. Symptoms of a larger hernia include:  A lump in the groin. This is easier to see when the person is standing. It might not be visible when he or she is lying down.  Pain or burning in the groin. This occurs especially when lifting, straining, or coughing.  A dull ache or a feeling of pressure in the groin.  A lump in the scrotum in men. Symptoms of a strangulated inguinal hernia can include:  A bulge in the groin that is very painful and tender to the touch.  A bulge that turns red or purple.  Fever, nausea, and vomiting.  The inability to have a bowel  movement or to pass gas. How is this diagnosed? This condition is diagnosed with a medical history and physical exam. Your health care provider may feel your groin area and ask you to cough. How is this treated? Treatment for this condition varies depending on the size of your hernia and whether you have symptoms. If you do not have symptoms, your health care provider may have you watch your hernia carefully and come in for follow-up visits. If your hernia is larger or if you have symptoms, your treatment will include surgery. Follow these instructions at home: Lifestyle  Drink enough fluid to keep your urine clear or pale yellow.  Eat a diet that includes a lot of fiber. Eat plenty of fruits, vegetables, and whole grains. Talk with your health care provider if you have questions.  Avoid lifting heavy objects.  Avoid standing for long periods of time.  Do not use tobacco products, including cigarettes, chewing tobacco, or e-cigarettes. If you need help quitting, ask your health care provider.  Maintain a healthy weight. General instructions  Do not try to force the hernia back in.  Watch your hernia for any changes in color or size. Let your health care provider know if any changes occur.  Take over-the-counter and prescription medicines only as told by your health care provider.  Keep all follow-up visits as told by your health care provider. This is important. Contact a health care provider if:  You have a fever.  You have new symptoms.  Your symptoms   get worse. Get help right away if:  You have pain in the groin that suddenly gets worse.  A bulge in the groin gets bigger suddenly and does not go down.  You are a man and you have a sudden pain in the scrotum, or the size of your scrotum suddenly changes.  A bulge in the groin area becomes red or purple and is painful to the touch.  You have nausea or vomiting that does not go away.  You feel your heart beating a lot  more quickly than normal.  You cannot have a bowel movement or pass gas. This information is not intended to replace advice given to you by your health care provider. Make sure you discuss any questions you have with your health care provider. Document Released: 01/18/2009 Document Revised: 02/07/2016 Document Reviewed: 07/12/2014  2017 Elsevier  

## 2016-10-30 NOTE — Progress Notes (Signed)
Patient ID: Hubbard HartshornDelbert M Watterson, male   DOB: 12-02-1950, 66 y.o.   MRN: [id number]  Chief Complaint  Patient presents with  . Pre-op Exam    HPI Ronald PippinsDelbert Margaretha GlassingM Engelbrecht is a 66 y.o. male.  Follow up from colonoscopy and recheck  For a possible right inguinal hernia. This was not evident on exam before. Colonoscopy completed- normal. I have reviewed the history of present illness with the patient.   HPI  Past Medical History:  Diagnosis Date  . Acid reflux   . GERD (gastroesophageal reflux disease)   . Hyperlipidemia   . Hypertension     Past Surgical History:  Procedure Laterality Date  . COLONOSCOPY WITH PROPOFOL N/A 09/30/2016   Procedure: COLONOSCOPY WITH PROPOFOL;  Surgeon: Kieth BrightlySeeplaputhur G Derrian Rodak, MD;  Location: [facility] ENDOSCOPY;  Service: Endoscopy;  Laterality: N/A;  . HERNIA REPAIR Left 2005  . VASCULAR SURGERY      Family History  Problem Relation Age of Onset  . Stroke Father     Social History Social History  Substance Use Topics  . Smoking status: Current Every Day Smoker    Packs/day: 1.50  . Smokeless tobacco: Never Used  . Alcohol use No    No Known Allergies  Current Outpatient Prescriptions  Medication Sig Dispense Refill  . acetaminophen (TYLENOL) 500 MG tablet Take 500 mg by mouth every 6 (six) hours as needed.    Marland Kitchen. aspirin 81 MG tablet Take 81 mg by mouth daily.    Marland Kitchen. atorvastatin (LIPITOR) 40 MG tablet Take 1 tablet (40 mg total) by mouth at bedtime. 90 tablet 1  . carvedilol (COREG) 12.5 MG tablet Take 1 tablet (12.5 mg total) by mouth daily. 90 tablet 1  . [employer] VITAMIN B12 1000 MCG tablet Take 1,000 mcg by mouth daily as needed. Reported on 02/20/2016  2  . gabapentin (NEURONTIN) 300 MG capsule Take 300 mg by mouth 3 (three) times daily.  6  . triamterene-hydrochlorothiazide (MAXZIDE) 75-50 MG tablet Take 1 tablet by mouth daily. 90 tablet 1  . Vitamin D, Ergocalciferol, (DRISDOL) 50000 units CAPS capsule Take 1 capsule (50,000 Units total) by mouth once a  week. For 12 weeks 12 capsule 0   No current facility-administered medications for this visit.     Review of Systems Review of Systems  Constitutional: Negative.   Respiratory: Negative.   Cardiovascular: Negative.     Blood pressure (!) 142/78, pulse 80, resp. rate 14, height 5\' 10"  (1.778 m), weight 164 lb (74.4 kg).  Physical Exam Physical Exam  Constitutional: He is oriented to person, place, and time. He appears well-developed and well-nourished.  HENT:  Mouth/Throat: Oropharynx is clear and moist.  Eyes: Conjunctivae are normal. No scleral icterus.  Neck: Neck supple.  Cardiovascular: Normal rate, regular rhythm and normal heart sounds.   Pulmonary/Chest: Effort normal and breath sounds normal.  Abdominal: Soft. Normal appearance and bowel sounds are normal. There is no tenderness. A hernia is present. Hernia confirmed positive in the right inguinal area ( small to medium sized reducible hernia. More apparent when he is standing).  Lymphadenopathy:    He has no cervical adenopathy.  Neurological: He is alert and oriented to person, place, and time.  Skin: Skin is warm and dry.    Data Reviewed Prior notes  Assessment    Right inguinal hernia    Plan    Hernia precautions and incarceration were discussed with the patient. If they develop symptoms of an incarcerated hernia, they were encouraged  to seek prompt medical attention.  I have recommended repair of the hernia using mesh on an outpatient basis in the near future. The risk of infection was reviewed. The role of prosthetic mesh to minimize the risk of recurrence was reviewed.     Patient's surgery has been scheduled for 11-04-16 at [facility] under monitor anesthesia care. It is okay for patient to continue 81 mg aspirin once daily pre-op.  This information has been scribed by Dorathy Daft RN, BSN,BC.   Nannette Zill G 10/30/2016, 11:44 AM

## 2016-10-31 ENCOUNTER — Inpatient Hospital Stay
Admission: RE | Admit: 2016-10-31 | Discharge: 2016-10-31 | Disposition: A | Discharge status: Home / Self Care | Payer: BC Managed Care – PPO | Source: Ambulatory Visit

## 2016-10-31 NOTE — Pre-Procedure Instructions (Signed)
Jared Harmon  ECHO COMPLETE WO IMAGE ENHANCING AGENT  Order# [id number]  Reading physician: Antonieta Iba, MD Ordering physician: Almond Lint, MD Study date: 10/13/16  Result Notes   Notes Recorded by Bryna Colander, RN on 10/14/2016 at 11:56 AM EST Reviewed results with patients wife per release form. She verbalized understanding and had no further questions at this time. ------  Notes Recorded by Bryna Colander, RN on 10/14/2016 at 11:41 AM EST Left voicemail message to call back for results. ------  Notes Recorded by Almond Lint, MD on 10/14/2016 at 10:22 AM EST Overall ok      Study Result   Result status: Final result                           *[facility]*                  [address]                        [place], [place] [address]                            [phone]  ------------------------------------------------------------------- Transthoracic Echocardiography  Patient:    Jared Harmon, Jared Harmon MR #:       [id number] Study Date: 10/13/2016 Gender:     M Age:        66 Height:     177.8 cm Weight:     72 kg BSA:        1.89 m^2 Pt. Status: Room:   ATTENDING    Default, Provider [phone]  SONOGRAPHER  [facility]atter  PERFORMING   [facility],   ORDERING     Almond Lint, MD  REFERRING    Almond Lint, MD  cc:  ------------------------------------------------------------------- LV EF: 60% -   65%  ------------------------------------------------------------------- History:   PMH:   Murmur.  Risk factors:  Current tobacco use. Hypertension. Dyslipidemia.  ------------------------------------------------------------------- Study Conclusions  - Left ventricle: The cavity size was normal. Systolic function was   normal. The estimated ejection fraction was in the range of 60%   to 65%. Wall motion was normal; there were no regional wall   motion abnormalities. Left ventricular diastolic function   parameters were  normal. - Aortic valve: Poorly visualized. Mildly thickened, mildly   calcified leaflets. three leaflets not clearly seen.   Transvalvular velocity was within the normal range. There was no   stenosis. Peak velocity (S): 153 cm/s. Mean gradient (S): 5 mm   Hg. - Left atrium: The atrium was normal in size. - Right ventricle: Systolic function was normal. - Pulmonary arteries: Systolic pressure was within the normal   range.  ------------------------------------------------------------------- Study data:  No prior study was available for comparison.  Study status:  Routine.  Procedure:  The patient reported no pain pre or post test. Transthoracic echocardiography. Image quality was adequate.  Study completion:  There were no complications. Transthoracic echocardiography.  M-mode, complete 2D, spectral Doppler, and color Doppler.  Birthdate:  Patient birthdate: June 07, 1951.  Age:  Patient is 66 yr old.  Sex:  Gender: male. BMI: 22.8 kg/m^2.  Blood pressure:     158/80  Patient status: Outpatient.  Study date:  Study date: 10/13/2016. Study time: 08:31 AM.  -------------------------------------------------------------------  ------------------------------------------------------------------- Left ventricle:  The cavity size was normal. Systolic function was normal. The  estimated ejection fraction was in the range of 60% to 65%. Wall motion was normal; there were no regional wall motion abnormalities. The transmitral flow pattern was normal. The deceleration time of the early transmitral flow velocity was normal. The pulmonary vein flow pattern was normal. The tissue Doppler parameters were normal. Left ventricular diastolic function parameters were normal.  ------------------------------------------------------------------- Aortic valve:  Poorly visualized. Mildly thickened, mildly calcified leaflets. three leaflets not clearly seen. Mobility was not restricted.  Doppler:   Transvalvular velocity was within the normal range. There was no stenosis. There was no regurgitation. VTI ratio of LVOT to aortic valve: 0.59. Valve area (VTI): 1.84 cm^2. Indexed valve area (VTI): 0.98 cm^2/m^2. Peak velocity ratio of LVOT to aortic valve: 0.48. Valve area (Vmax): 1.52 cm^2. Indexed valve area (Vmax): 0.81 cm^2/m^2. Mean velocity ratio of LVOT to aortic valve: 0.5. Valve area (Vmean): 1.58 cm^2. Indexed valve area (Vmean): 0.84 cm^2/m^2.    Mean gradient (S): 5 mm Hg. Peak gradient (S): 9 mm Hg.  ------------------------------------------------------------------- Aorta:  Aortic root: The aortic root was normal in size.  ------------------------------------------------------------------- Mitral valve:   Structurally normal valve.   Mobility was not restricted.  Doppler:  Transvalvular velocity was within the normal range. There was no evidence for stenosis. There was trivial regurgitation.  ------------------------------------------------------------------- Left atrium:  The atrium was normal in size.  ------------------------------------------------------------------- Right ventricle:  The cavity size was normal. Wall thickness was normal. Systolic function was normal.  ------------------------------------------------------------------- Pulmonic valve:    Structurally normal valve.   Cusp separation was normal.  Doppler:  Transvalvular velocity was within the normal range. There was no evidence for stenosis. There was trivial regurgitation.  ------------------------------------------------------------------- Tricuspid valve:   Structurally normal valve.    Doppler: Transvalvular velocity was within the normal range. There was trivial regurgitation.  ------------------------------------------------------------------- Pulmonary artery:   The main pulmonary artery was normal-sized. Systolic pressure was within the normal  range.  ------------------------------------------------------------------- Right atrium:  The atrium was normal in size.  ------------------------------------------------------------------- Pericardium:  There was no pericardial effusion.  ------------------------------------------------------------------- Systemic veins: Inferior vena cava: The vessel was normal in size. The respirophasic diameter changes were in the normal range (>= 50%), consistent with normal central venous pressure.  ------------------------------------------------------------------- Measurements   Left ventricle                           Value          Reference  LV ID, ED, PLAX chordal                  45.9  mm       43 - 52  LV ID, ES, PLAX chordal                  29.4  mm       23 - 38  LV fx shortening, PLAX chordal           36    %        >=29  LV PW thickness, ED                      9.41  mm       ----------  IVS/LV PW ratio, ED                      1.13           <=1.3  Stroke volume,  2D                        61    ml       ----------  Stroke volume/bsa, 2D                    32    ml/m^2   ----------  LV ejection fraction, 1-p A4C            54    %        ----------  LV end-diastolic volume, 2-p             47    ml       ----------  LV end-systolic volume, 2-p              17    ml       ----------  LV ejection fraction, 2-p                63    %        ----------  Stroke volume, 2-p                       30    ml       ----------  LV end-diastolic volume/bsa, 2-p         25    ml/m^2   ----------  LV end-systolic volume/bsa, 2-p          9     ml/m^2   ----------  Stroke volume/bsa, 2-p                   15.7  ml/m^2   ----------  LV e&', lateral                           11.6  cm/s     ----------  LV E/e&', lateral                         5.06           ----------  LV e&', medial                            6.64  cm/s     ----------  LV E/e&', medial                          8.84            ----------  LV e&', average                           9.12  cm/s     ----------  LV E/e&', average                         6.44           ----------    Ventricular septum                       Value          Reference  IVS thickness, ED  10.6  mm       ----------    LVOT                                     Value          Reference  LVOT ID, S                               20    mm       ----------  LVOT area                                3.14  cm^2     ----------  LVOT peak velocity, S                    73.9  cm/s     ----------  LVOT mean velocity, S                    53    cm/s     ----------  LVOT VTI, S                              19.5  cm       ----------    Aortic valve                             Value          Reference  Aortic valve peak velocity, S            153   cm/s     ----------  Aortic valve mean velocity, S            105   cm/s     ----------  Aortic valve VTI, S                      33.2  cm       ----------  Aortic mean gradient, S                  5     mm Hg    ----------  Aortic peak gradient, S                  9     mm Hg    ----------  VTI ratio, LVOT/AV                       0.59           ----------  Aortic valve area, VTI                   1.84  cm^2     ----------  Aortic valve area/bsa, VTI               0.98  cm^2/m^2 ----------  Velocity ratio, peak, LVOT/AV            0.48           ----------  Aortic valve area, peak velocity         1.52  cm^2     ----------  Aortic  valve area/bsa, peak              0.81  cm^2/m^2 ----------  velocity  Velocity ratio, mean, LVOT/AV            0.5            ----------  Aortic valve area, mean velocity         1.58  cm^2     ----------  Aortic valve area/bsa, mean              0.84  cm^2/m^2 ----------  velocity    Aorta                                    Value          Reference  Aortic root ID, ED                       34    mm       ----------    Left atrium                               Value          Reference  LA ID, A-P, ES                           29    mm       ----------  LA ID/bsa, A-P                           1.54  cm/m^2   <=2.2  LA volume, S                             42.8  ml       ----------  LA volume/bsa, S                         22.7  ml/m^2   ----------  LA volume, ES, 1-p A4C                   47.8  ml       ----------  LA volume/bsa, ES, 1-p A4C               25.3  ml/m^2   ----------  LA volume, ES, 1-p A2C                   32.2  ml       ----------  LA volume/bsa, ES, 1-p A2C               17.1  ml/m^2   ----------    Mitral valve                             Value          Reference  Mitral E-wave peak velocity              58.7  cm/s     ----------  Mitral A-wave peak velocity              47.1  cm/s     ----------  Mitral deceleration time         (H)     264   ms       150 - 230  Mitral E/A ratio, peak                   1.2            ----------    Pulmonary arteries                       Value          Reference  PA pressure, S, DP                       6     mm Hg    <=30    Tricuspid valve                          Value          Reference  Tricuspid regurg peak velocity           79.3  cm/s     ----------  Tricuspid peak RV-RA gradient            3     mm Hg    ----------    Right atrium                             Value          Reference  RA ID, S-I, ES, A4C                      40.2  mm       34 - 49  RA area, ES, A4C                         12.2  cm^2     8.3 - 19.5  RA volume, ES, A/L                       26.6  ml       ----------  RA volume/bsa, ES, A/L                   14.1  ml/m^2   ----------    Systemic veins                           Value          Reference  Estimated CVP                            3     mm Hg    ----------    Right ventricle                          Value          Reference  TAPSE                                    20.3  mm       ----------  RV pressure, S, DP  6     mm Hg     <=30  RV s&', lateral, S                        9.57  cm/s     ----------  Legend: (L)  and  (H)  mark values outside specified reference range.  ------------------------------------------------------------------- Prepared and Electronically Authenticated by  Dossie Arbour, MD, [facility] 2018-01-29T13:57:45  PACS Images   Show images for ECHOCARDIOGRAM COMPLETE  Patient Information   Patient Name Jared Harmon, Jared Harmon Sex Male DOB 31-Dec-1950 SSN [id number]  Reason For Exam  Priority: Routine  Dx: Murmur [R01.1 (ICD-10-CM)]  Surgical History   Surgical History   No past medical history on file.    Other Surgical History   Procedure Laterality Date Comment Source  COLONOSCOPY WITH PROPOFOL N/A 09/30/2016 Procedure: COLONOSCOPY WITH PROPOFOL; Surgeon: Kieth Brightly, MD; Location: [facility] ENDOSCOPY; Service: Endoscopy; Laterality: N/A; Provider  HERNIA REPAIR Left 2005  Provider  VASCULAR SURGERY    Provider    Performing Technologist/Nurse   Performing Technologist/Nurse: Franki Cabot McFatter                    Implants     No active implants to display in this view.  Order-Level Documents:   There are no order-level documents.  Encounter-Level Documents - 10/13/2016:   Electronic signature on 10/13/2016 8:24 AM      Signed   Electronically signed by Antonieta Iba, MD on 10/13/16 at 1357 EST  Printable Result Report   Result Report

## 2016-11-03 ENCOUNTER — Ambulatory Visit: Payer: BC Managed Care – PPO | Admitting: General Surgery

## 2016-11-03 ENCOUNTER — Encounter
Admission: RE | Admit: 2016-11-03 | Discharge: 2016-11-03 | Disposition: A | Discharge status: Home / Self Care | Payer: BC Managed Care – PPO | Source: Ambulatory Visit | Attending: General Surgery | Admitting: General Surgery

## 2016-11-03 HISTORY — DX: Unspecified atherosclerosis: I70.90

## 2016-11-03 HISTORY — DX: Cardiac murmur, unspecified: R01.1

## 2016-11-03 MED ORDER — CEFAZOLIN SODIUM-DEXTROSE 2-4 GM/100ML-% IV SOLN
2.0000 g | INTRAVENOUS | Status: AC
  Administered 2016-11-04: 2000 mg via INTRAVENOUS

## 2016-11-03 NOTE — Pre-Procedure Instructions (Signed)
Progress Notes Encounter Date: 09/16/2016 9:15 AM Almond LintAileen Ingal, MD  Cardiology  Expand All Collapse All   [] Hide copied text [] Hover for attribution information     Cardiology Office Note   Date:  09/16/2016   ID:  Jared HartshornDelbert M Harmon, DOB 09-06-1951, MRN [id number]  Referring Doctor:  Brayton ElSyed Shah, MD    Cardiologist:   Almond LintAileen Ingal, MD   Reason for consultation:      Chief Complaint  Patient presents with  . other    New Patient. Referred by Dr.Shah for cardiac murmur 12/7. Pt states he has been feeling well. Reviewed meds with pt verbally.      History of Present Illness: Jared Harmon is a 66 y.o. male who presents for Evaluation of cardiac murmur. This was noted by PCP on a recent visit.  Patient does not volunteer any symptoms. He denies chest pain, chest discomfort, shortness of breath, palpitations. He works as a Data processing managerschool custodian and does not have any issues with chest pain or breath with exertion during his work hours. He does not usually take the stairs but there are rales all over the school. No loss of consciousness.   ROS:  Please see the history of present illness. Aside from mentioned under HPI, all other systems are reviewed and negative.         Past Medical History:  Diagnosis Date  . Allergy   . GERD (gastroesophageal reflux disease)   . Hypertension          Past Surgical History:  Procedure Laterality Date  . HERNIA REPAIR       reports that he has been smoking.  He has been smoking about 1.50 packs per day. He has never used smokeless tobacco. He reports that he does not drink alcohol or use drugs.   family history includes Stroke in his father.         Outpatient Medications Prior to Visit  Medication Sig Dispense Refill  . aspirin 81 MG tablet Take 81 mg by mouth daily.    Marland Kitchen. atorvastatin (LIPITOR) 40 MG tablet Take 1 tablet (40 mg total) by mouth at bedtime. 90 tablet 1  . carvedilol (COREG) 12.5 MG tablet  Take 1 tablet (12.5 mg total) by mouth daily. 90 tablet 1  . CVS VITAMIN B12 1000 MCG tablet Take 1,000 mcg by mouth daily as needed. Reported on 02/20/2016  2  . gabapentin (NEURONTIN) 300 MG capsule Take 300 mg by mouth 3 (three) times daily.  6  . triamterene-hydrochlorothiazide (MAXZIDE) 75-50 MG tablet Take 1 tablet by mouth daily. 90 tablet 1  . atorvastatin (LIPITOR) 40 MG tablet TAKE 1 TABLET (40 MG TOTAL) BY MOUTH AT BEDTIME. (Patient not taking: Reported on 09/16/2016) 90 tablet 0   No facility-administered medications prior to visit.      Allergies: Patient has no known allergies.    PHYSICAL EXAM: VS:  BP (!) 150/84 (BP Location: Right Arm, Patient Position: Sitting, Cuff Size: Normal)   Pulse 70   Ht 5\' 11"  (1.803 m)   Wt 165 lb 8 oz (75.1 kg)   BMI 23.08 kg/m  , Body mass index is 23.08 kg/m.    Wt Readings from Last 3 Encounters:  09/16/16 165 lb 8 oz (75.1 kg)  09/09/16 164 lb 8 oz (74.6 kg)  08/21/16 165 lb 1.6 oz (74.9 kg)    GENERAL:  well developed, well nourished, not in acute distress HEENT: normocephalic, pink conjunctivae, anicteric sclerae, no xanthelasma, normal dentition,  oropharynx clear NECK:  no neck vein engorgement, JVP normal, no hepatojugular reflux, carotid upstroke brisk and symmetric, no bruit, no thyromegaly, no lymphadenopathy LUNGS:  good respiratory effort, clear to auscultation bilaterally CV:  PMI not displaced, no thrills, no lifts, S1 and S2 within normal limits, no palpable S3 or S4, Soft systolic murmur, no rubs, no gallops ABD:  Soft, nontender, nondistended, normoactive bowel sounds, no abdominal aortic bruit, no hepatomegaly, no splenomegaly MS: nontender back, no kyphosis, no scoliosis, no joint deformities EXT:  2+ DP/PT pulses, no edema, no varicosities, no cyanosis, no clubbing SKIN: warm, nondiaphoretic, normal turgor, no ulcers NEUROPSYCH: alert, oriented to person, place, and time, sensory/motor grossly intact, normal mood,  appropriate affect  Recent Labs: 02/20/2016: ALT 10; BUN 10; Creatinine, Ser 0.86; Potassium 4.9; Sodium 137   Lipid Panel Labs(Brief)          Component Value Date/Time   CHOL 159 02/20/2016 1006   TRIG 71 02/20/2016 1006   HDL 40 02/20/2016 1006   CHOLHDL 4.0 02/20/2016 1006   LDLCALC 105 (H) 02/20/2016 1006       Other studies Reviewed:  EKG:  The ekg from 09/16/2016 was personally reviewed by me and it revealed sinus rhythm, sinus arrhythmia, 70 BPM. Artifact noted.  Additional studies/ records that were reviewed personally reviewed by me today include: None available   ASSESSMENT AND PLAN:  Systolic murmur Recommend echocardiogram for further evaluation  Hypertension Recommend light pressure monitoring at home. He has not been doing this although they have a blood pressure monitor at home. Goal is less than 130/80. Agree with aspirin 81 mg by mouth daily.  Tobacco use We discussed the importance of smoking cessation and different strategies for quitting.    Current medicines are reviewed at length with the patient today.  The patient does not have concerns regarding medicines.  Labs/ tests ordered today include:     Orders Placed This Encounter  Procedures  . EKG 12-Lead  . ECHOCARDIOGRAM COMPLETE    I had a lengthy and detailed discussion with the patient regarding diagnoses, prognosis, diagnostic options, treatment options, and side effects of medications.   I counseled the patient on importance of lifestyle modification including heart healthy diet, regular physical activity , and smoking cessation.   Disposition:   FU with undersigned after tests    Signed, Almond Lint, MD  09/16/2016 10:05 AM    [facility]  This note was generated in part with voice recognition software and I apologize for any typographical errors that were not detected and corrected.       Electronically signed by Almond Lint, MD at 09/16/2016 10:06 AM      Office Visit on 09/16/2016        Detailed Report

## 2016-11-03 NOTE — Patient Instructions (Signed)
  Your procedure is scheduled on: 11-04-16 Report to Same Day Surgery 2nd floor medical mall [facility](Medical Mall Entrance-take elevator on left to 2nd floor.  Check in with surgery information desk.) To find out your arrival time please call [phone] between 1PM - 3PM on 11-03-16  Remember: Instructions that are not followed completely may result in serious medical risk, up to and including death, or upon the discretion of your surgeon and anesthesiologist your surgery may need to be rescheduled.    _x___ 1. Do not eat food or drink liquids after midnight. No gum chewing or hard candies.     __x__ 2. No Alcohol for 24 hours before or after surgery.   __x__3. No Smoking for 24 prior to surgery.   ____  4. Bring all medications with you on the day of surgery if instructed.    __x__ 5. Notify your doctor if there is any change in your medical condition     (cold, fever, infections).     Do not wear jewelry, make-up, hairpins, clips or nail polish.  Do not wear lotions, powders, or perfumes. You may wear deodorant.  Do not shave 48 hours prior to surgery. Men may shave face and neck.  Do not bring valuables to the hospital.    [facility] is not responsible for any belongings or valuables.               Contacts, dentures or bridgework may not be worn into surgery.  Leave your suitcase in the car. After surgery it may be brought to your room.  For patients admitted to the hospital, discharge time is determined by your treatment team.   Patients discharged the day of surgery will not be allowed to drive home.  You will need someone to drive you home and stay with you the night of your procedure.    Please read over the following fact sheets that you were given:   [facility] Preparing for Surgery and or MRSA Information   _x___ Take these medicines the morning of surgery with A SIP OF WATER:    1. GABAPENTIN  2. CARVEDILOL (PTS WIFE UNSURE IF HE TAKES IN AM OR PM BUT SHE UNDERSTANDS TO  GET PT TO TAKE CARVEDILOL IN AM IF HE NORMALLY TAKES IN AM)  3.  4.  5.  6.  ____Fleets enema or Magnesium Citrate as directed.   ____ Use CHG Soap or sage wipes as directed on instruction sheet   ____ Use inhalers on the day of surgery and bring to hospital day of surgery  ____ Stop metformin 2 days prior to surgery    ____ Take 1/2 of usual insulin dose the night before surgery and none on the morning of surgery.   ____ Stop Aspirin, Coumadin, Pllavix ,Eliquis, Effient, or Pradaxa-OK TO CONTINUE 81 MG ASA PER DR SANKARS H&P-DO NOT TAKE DAY OF SURGERY  x__ Stop Anti-inflammatories such as Advil, Aleve, Ibuprofen, Motrin, Naproxen,          Naprosyn, Goodies powders or aspirin products NOW-Ok to take Tylenol.   ____ Stop supplements until after surgery.    ____ Bring C-Pap to the hospital.

## 2016-11-04 ENCOUNTER — Ambulatory Visit: Payer: BC Managed Care – PPO | Admitting: Anesthesiology

## 2016-11-04 ENCOUNTER — Ambulatory Visit
Admission: RE | Admit: 2016-11-04 | Discharge: 2016-11-04 | Disposition: A | Discharge status: Home / Self Care | Payer: BC Managed Care – PPO | Source: Ambulatory Visit | Attending: General Surgery | Admitting: General Surgery

## 2016-11-04 ENCOUNTER — Encounter
Admission: RE | Disposition: A | Discharge status: Home / Self Care | Payer: Self-pay | Source: Ambulatory Visit | Attending: General Surgery

## 2016-11-04 ENCOUNTER — Telehealth: Payer: Self-pay | Admitting: *Deleted

## 2016-11-04 ENCOUNTER — Encounter: Payer: Self-pay | Admitting: *Deleted

## 2016-11-04 DIAGNOSIS — K219 Gastro-esophageal reflux disease without esophagitis: Secondary | ICD-10-CM | POA: Diagnosis not present

## 2016-11-04 DIAGNOSIS — E785 Hyperlipidemia, unspecified: Secondary | ICD-10-CM | POA: Insufficient documentation

## 2016-11-04 DIAGNOSIS — K4090 Unilateral inguinal hernia, without obstruction or gangrene, not specified as recurrent: Secondary | ICD-10-CM | POA: Diagnosis not present

## 2016-11-04 DIAGNOSIS — Z7982 Long term (current) use of aspirin: Secondary | ICD-10-CM | POA: Diagnosis not present

## 2016-11-04 DIAGNOSIS — I10 Essential (primary) hypertension: Secondary | ICD-10-CM | POA: Insufficient documentation

## 2016-11-04 DIAGNOSIS — F17210 Nicotine dependence, cigarettes, uncomplicated: Secondary | ICD-10-CM | POA: Diagnosis not present

## 2016-11-04 HISTORY — PX: INGUINAL HERNIA REPAIR: SHX194

## 2016-11-04 HISTORY — PX: INSERTION OF MESH: SHX5868

## 2016-11-04 SURGERY — REPAIR, HERNIA, INGUINAL, ADULT
Anesthesia: General | Laterality: Right

## 2016-11-04 MED ORDER — MIDAZOLAM HCL 2 MG/2ML IJ SOLN
INTRAMUSCULAR | Status: AC
  Filled 2016-11-04: qty 2

## 2016-11-04 MED ORDER — MIDAZOLAM HCL 2 MG/2ML IJ SOLN
INTRAMUSCULAR | Status: DC | PRN
  Administered 2016-11-04: 2 mg via INTRAVENOUS

## 2016-11-04 MED ORDER — LIDOCAINE HCL (PF) 1 % IJ SOLN
INTRAMUSCULAR | Status: AC
  Filled 2016-11-04: qty 30

## 2016-11-04 MED ORDER — CEFAZOLIN SODIUM-DEXTROSE 2-4 GM/100ML-% IV SOLN
INTRAVENOUS | Status: AC
  Administered 2016-11-04: 2000 mg via INTRAVENOUS
  Filled 2016-11-04: qty 100

## 2016-11-04 MED ORDER — OXYCODONE-ACETAMINOPHEN 5-325 MG PO TABS: 1.0000 | ORAL_TABLET | ORAL | 0 refills | Status: DC | PRN

## 2016-11-04 MED ORDER — FAMOTIDINE 20 MG PO TABS
20.0000 mg | ORAL_TABLET | Freq: Once | ORAL | Status: AC
  Administered 2016-11-04: 20 mg via ORAL

## 2016-11-04 MED ORDER — PROPOFOL 500 MG/50ML IV EMUL
INTRAVENOUS | Status: AC
  Filled 2016-11-04: qty 50

## 2016-11-04 MED ORDER — FAMOTIDINE 20 MG PO TABS
ORAL_TABLET | ORAL | Status: AC
  Administered 2016-11-04: 20 mg via ORAL
  Filled 2016-11-04: qty 1

## 2016-11-04 MED ORDER — PROPOFOL 10 MG/ML IV BOLUS
INTRAVENOUS | Status: DC | PRN
  Administered 2016-11-04: 40 mg via INTRAVENOUS

## 2016-11-04 MED ORDER — PROPOFOL 500 MG/50ML IV EMUL
INTRAVENOUS | Status: DC | PRN
  Administered 2016-11-04: 100 ug/kg/min via INTRAVENOUS

## 2016-11-04 MED ORDER — CHLORHEXIDINE GLUCONATE CLOTH 2 % EX PADS
6.0000 | MEDICATED_PAD | Freq: Once | CUTANEOUS | Status: AC
  Administered 2016-11-04: 6 via TOPICAL

## 2016-11-04 MED ORDER — ONDANSETRON HCL 4 MG/2ML IJ SOLN: 4.0000 mg | Freq: Once | INTRAMUSCULAR | Status: DC | PRN

## 2016-11-04 MED ORDER — FENTANYL CITRATE (PF) 100 MCG/2ML IJ SOLN
INTRAMUSCULAR | Status: DC | PRN
  Administered 2016-11-04: 25 ug via INTRAVENOUS

## 2016-11-04 MED ORDER — BUPIVACAINE HCL (PF) 0.5 % IJ SOLN
INTRAMUSCULAR | Status: AC
  Filled 2016-11-04: qty 30

## 2016-11-04 MED ORDER — FENTANYL CITRATE (PF) 100 MCG/2ML IJ SOLN: 25.0000 ug | INTRAMUSCULAR | Status: DC | PRN

## 2016-11-04 MED ORDER — FENTANYL CITRATE (PF) 100 MCG/2ML IJ SOLN
INTRAMUSCULAR | Status: AC
  Filled 2016-11-04: qty 2

## 2016-11-04 MED ORDER — ACETAMINOPHEN 10 MG/ML IV SOLN
INTRAVENOUS | Status: DC | PRN
  Administered 2016-11-04: 1000 mg via INTRAVENOUS

## 2016-11-04 MED ORDER — LACTATED RINGERS IV SOLN
INTRAVENOUS | Status: DC
  Administered 2016-11-04: 09:00:00 via INTRAVENOUS

## 2016-11-04 MED ORDER — KETOROLAC TROMETHAMINE 30 MG/ML IJ SOLN
INTRAMUSCULAR | Status: DC | PRN
  Administered 2016-11-04: 30 mg via INTRAVENOUS

## 2016-11-04 MED ORDER — LIDOCAINE HCL 1 % IJ SOLN
INTRAMUSCULAR | Status: DC | PRN
  Administered 2016-11-04: 9 mL via SUBCUTANEOUS
  Administered 2016-11-04: 33 mL via SUBCUTANEOUS

## 2016-11-04 SURGICAL SUPPLY — 29 items
BLADE SURG 15 STRL SS SAFETY (BLADE) ×4 IMPLANT
CANISTER SUCT 1200ML W/VALVE (MISCELLANEOUS) ×4 IMPLANT
CHLORAPREP W/TINT 26ML (MISCELLANEOUS) ×4 IMPLANT
DECANTER SPIKE VIAL GLASS SM (MISCELLANEOUS) IMPLANT
DERMABOND ADVANCED (GAUZE/BANDAGES/DRESSINGS) ×2
DERMABOND ADVANCED .7 DNX12 (GAUZE/BANDAGES/DRESSINGS) ×2 IMPLANT
DRAIN PENROSE 1/4X12 LTX (DRAIN) ×4 IMPLANT
DRAPE LAPAROTOMY 100X77 ABD (DRAPES) ×4 IMPLANT
ELECT REM PT RETURN 9FT ADLT (ELECTROSURGICAL) ×4
ELECTRODE REM PT RTRN 9FT ADLT (ELECTROSURGICAL) ×2 IMPLANT
GLOVE BIO SURGEON STRL SZ7 (GLOVE) ×4 IMPLANT
GOWN STRL REUS W/ TWL LRG LVL3 (GOWN DISPOSABLE) ×4 IMPLANT
GOWN STRL REUS W/TWL LRG LVL3 (GOWN DISPOSABLE) ×4
KIT RM TURNOVER STRD PROC AR (KITS) ×4 IMPLANT
LABEL OR SOLS (LABEL) IMPLANT
MESH PARIETEX PROGRIP RIGHT (Mesh General) ×4 IMPLANT
NEEDLE HYPO 22GX1.5 SAFETY (NEEDLE) ×4 IMPLANT
NEEDLE HYPO 25X1 1.5 SAFETY (NEEDLE) ×4 IMPLANT
NS IRRIG 500ML POUR BTL (IV SOLUTION) ×4 IMPLANT
PACK BASIN MINOR ARMC (MISCELLANEOUS) ×4 IMPLANT
SUT PDS 2-0 27IN (SUTURE) IMPLANT
SUT VIC AB 2-0 SH 27 (SUTURE) ×2
SUT VIC AB 2-0 SH 27XBRD (SUTURE) ×2 IMPLANT
SUT VIC AB 3-0 54X BRD REEL (SUTURE) ×2 IMPLANT
SUT VIC AB 3-0 BRD 54 (SUTURE) ×2
SUT VIC AB 3-0 SH 27 (SUTURE)
SUT VIC AB 3-0 SH 27X BRD (SUTURE) IMPLANT
SUT VIC AB 4-0 FS2 27 (SUTURE) ×4 IMPLANT
SYR CONTROL 10ML (SYRINGE) ×4 IMPLANT

## 2016-11-04 NOTE — Interval H&P Note (Signed)
History and Physical Interval Note:  11/04/2016 9:36 AM  Jared Harmon  has presented today for surgery, with the diagnosis of RIGHT HERNIA  The various methods of treatment have been discussed with the patient and family. After consideration of risks, benefits and other options for treatment, the patient has consented to  Procedure(s): HERNIA REPAIR INGUINAL ADULT (Right) as a surgical intervention .  The patient's history has been reviewed, patient examined, no change in status, stable for surgery.  I have reviewed the patient's chart and labs.  Questions were answered to the patient's satisfaction.     Symphonie Schneiderman G

## 2016-11-04 NOTE — Telephone Encounter (Signed)
Wife picked up papers.

## 2016-11-04 NOTE — Op Note (Signed)
Preop diagnosis: Right inguinal hernia  Post op diagnosis: Same indirect variety  Operation: Repair right inguinal hernia with Progrip mesh  Surgeon: Kathreen CosierS. G. Mahlia Fernando    Assistant:     Anesthesia: Monitored anesthesia care.  Complications: None  EBL: Minimal  Drains: None  Description: Patient was placed supine on the operating table and With adequate IV sedation and monitoring the right inguinal region was prepped and draped as sterile field. Timeout was performed. Local anesthetic containing 0.5% Marcaine mixed with 1% Xylocaine was instilled to obtain an adequate block around the right inguinal region. Skin incision was made along the medial two thirds of the inguinal canal and deepened through the layers down to the external oblique. Bleeding was controlled with cautery and ligatures of 3-0 Vicryl. The external oblique was opened along the line of its fibers. The inguinal canal area was infiltrated also with movement of local anesthetic the cord was then dissected free from the posterior wall. The ilioinguinal nerve was identified in the superior location and away from the cord. After the posterior wall was freed there was no evidence of a direct hernia noted. The fascia over the spermatic cord was opened and dissection performed to expose a markedly thickened peritoneal sac with no contents in it. This was freed all the way to the internal ring area and then all sac was opened. Under direct vision suture ligature of the neck of the sac and a high level was performed with 2-0 Vicryl. The sac was amputated in the suture transfixed to the undersurface of the internal oblique. The fascia over the cord was then reapproximated with 2-0 Vicryl. A Progrip mesh was then placed around the cord and laid down against the posterior wall securely. The medial and was tacked to the pubic tubercle with 2 stitches of 2-0 PDS. Wound was irrigated and additional local anesthetic was instilled totaling 42 mL. External  oblique was closed with running 2-0 PDS. Subcutaneous tissue closed with 3-0 Vicryl. Skin closed with subcuticular 4-0 Vicryl covered with Dermabond. Procedure was well-tolerated knee was subsequently returned recovery room stable condition.

## 2016-11-04 NOTE — Anesthesia Preprocedure Evaluation (Signed)
Anesthesia Evaluation  Patient identified by MRN, date of birth, ID band Patient awake    Reviewed: Allergy & Precautions, H&P , NPO status , Patient's Chart, lab work & pertinent test results, reviewed documented beta blocker date and time   Airway Mallampati: II   Neck ROM: full    Dental  (+) Poor Dentition, Upper Dentures, Lower Dentures   Pulmonary neg pulmonary ROS, neg shortness of breath, COPD, Current Smoker,  2 or 3 packs per day   Pulmonary exam normal        Cardiovascular hypertension, negative cardio ROS Normal cardiovascular exam+ Valvular Problems/Murmurs  Rhythm:regular Rate:Normal     Neuro/Psych Seizures -, Well Controlled,   Neuromuscular disease negative neurological ROS  negative psych ROS   GI/Hepatic negative GI ROS, Neg liver ROS, GERD  Medicated,  Endo/Other  negative endocrine ROS  Renal/GU negative Renal ROS  negative genitourinary   Musculoskeletal   Abdominal   Peds  Hematology negative hematology ROS (+)   Anesthesia Other Findings Past Medical History: No date: Acid reflux 2014: Atherosclerotic occlusive disease     Comment: LE No date: GERD (gastroesophageal reflux disease) No date: Heart murmur     Comment: NEWLY DX BY PCP-SEEN BY CARDIOLOGIST DR Shawnie DapperINGAL               JAN 2018-NOTE , ECHO IN EPIC No date: Hyperlipidemia No date: Hypertension Past Surgical History: 09/30/2016: COLONOSCOPY WITH PROPOFOL N/A     Comment: Procedure: COLONOSCOPY WITH PROPOFOL;                Surgeon: Kieth BrightlySeeplaputhur G Sankar, MD;  Location:               [facility] ENDOSCOPY;  Service: Endoscopy;                Laterality: N/A; 2005: HERNIA REPAIR Left No date: VASCULAR SURGERY     Comment: ANGIOPLASTY AND STENTS IN EXTERNAL ILIAC               ARTERIES BILATERAL  (DR SCHNIER) BMI    Body Mass Index:  23.53 kg/m     Reproductive/Obstetrics negative OB ROS                              Anesthesia Physical Anesthesia Plan  ASA: III  Anesthesia Plan: General   Post-op Pain Management:    Induction:   Airway Management Planned:   Additional Equipment:   Intra-op Plan:   Post-operative Plan:   Informed Consent: I have reviewed the patients History and Physical, chart, labs and discussed the procedure including the risks, benefits and alternatives for the proposed anesthesia with the patient or authorized representative who has indicated his/her understanding and acceptance.   Dental Advisory Given  Plan Discussed with: CRNA  Anesthesia Plan Comments:         Anesthesia Quick Evaluation

## 2016-11-04 NOTE — H&P (View-Only) (Signed)
Patient ID: Jared HartshornDelbert M Harmon, male   DOB: 12-02-1950, 66 y.o.   MRN: [id number]  Chief Complaint  Patient presents with  . Pre-op Exam    HPI Jared Harmon is a 66 y.o. male.  Follow up from colonoscopy and recheck  For a possible right inguinal hernia. This was not evident on exam before. Colonoscopy completed- normal. I have reviewed the history of present illness with the patient.   HPI  Past Medical History:  Diagnosis Date  . Acid reflux   . GERD (gastroesophageal reflux disease)   . Hyperlipidemia   . Hypertension     Past Surgical History:  Procedure Laterality Date  . COLONOSCOPY WITH PROPOFOL N/A 09/30/2016   Procedure: COLONOSCOPY WITH PROPOFOL;  Surgeon: Kieth BrightlySeeplaputhur G Makayleigh Poliquin, MD;  Location: [facility] ENDOSCOPY;  Service: Endoscopy;  Laterality: N/A;  . HERNIA REPAIR Left 2005  . VASCULAR SURGERY      Family History  Problem Relation Age of Onset  . Stroke Father     Social History Social History  Substance Use Topics  . Smoking status: Current Every Day Smoker    Packs/day: 1.50  . Smokeless tobacco: Never Used  . Alcohol use No    No Known Allergies  Current Outpatient Prescriptions  Medication Sig Dispense Refill  . acetaminophen (TYLENOL) 500 MG tablet Take 500 mg by mouth every 6 (six) hours as needed.    Marland Kitchen. aspirin 81 MG tablet Take 81 mg by mouth daily.    Marland Kitchen. atorvastatin (LIPITOR) 40 MG tablet Take 1 tablet (40 mg total) by mouth at bedtime. 90 tablet 1  . carvedilol (COREG) 12.5 MG tablet Take 1 tablet (12.5 mg total) by mouth daily. 90 tablet 1  . CVS VITAMIN B12 1000 MCG tablet Take 1,000 mcg by mouth daily as needed. Reported on 02/20/2016  2  . gabapentin (NEURONTIN) 300 MG capsule Take 300 mg by mouth 3 (three) times daily.  6  . triamterene-hydrochlorothiazide (MAXZIDE) 75-50 MG tablet Take 1 tablet by mouth daily. 90 tablet 1  . Vitamin D, Ergocalciferol, (DRISDOL) 50000 units CAPS capsule Take 1 capsule (50,000 Units total) by mouth once a  week. For 12 weeks 12 capsule 0   No current facility-administered medications for this visit.     Review of Systems Review of Systems  Constitutional: Negative.   Respiratory: Negative.   Cardiovascular: Negative.     Blood pressure (!) 142/78, pulse 80, resp. rate 14, height 5\' 10"  (1.778 m), weight 164 lb (74.4 kg).  Physical Exam Physical Exam  Constitutional: He is oriented to person, place, and time. He appears well-developed and well-nourished.  HENT:  Mouth/Throat: Oropharynx is clear and moist.  Eyes: Conjunctivae are normal. No scleral icterus.  Neck: Neck supple.  Cardiovascular: Normal rate, regular rhythm and normal heart sounds.   Pulmonary/Chest: Effort normal and breath sounds normal.  Abdominal: Soft. Normal appearance and bowel sounds are normal. There is no tenderness. A hernia is present. Hernia confirmed positive in the right inguinal area ( small to medium sized reducible hernia. More apparent when he is standing).  Lymphadenopathy:    He has no cervical adenopathy.  Neurological: He is alert and oriented to person, place, and time.  Skin: Skin is warm and dry.    Data Reviewed Prior notes  Assessment    Right inguinal hernia    Plan    Hernia precautions and incarceration were discussed with the patient. If they develop symptoms of an incarcerated hernia, they were encouraged  to seek prompt medical attention.  I have recommended repair of the hernia using mesh on an outpatient basis in the near future. The risk of infection was reviewed. The role of prosthetic mesh to minimize the risk of recurrence was reviewed.     Patient's surgery has been scheduled for 11-04-16 at [facility] under monitor anesthesia care. It is okay for patient to continue 81 mg aspirin once daily pre-op.  This information has been scribed by Dorathy Daft RN, BSN,BC.   Myiah Petkus G 10/30/2016, 11:44 AM

## 2016-11-04 NOTE — Telephone Encounter (Signed)
FMLA papers completed. 

## 2016-11-04 NOTE — Transfer of Care (Signed)
Immediate Anesthesia Transfer of Care Note  Patient: Jared HartshornDelbert M Harmon  Procedure(s) Performed: Procedure(s): HERNIA REPAIR INGUINAL ADULT (Right) INSERTION OF MESH  Patient Location: PACU  Anesthesia Type:General  Level of Consciousness: sedated and responds to stimulation  Airway & Oxygen Therapy: Patient Spontanous Breathing and Patient connected to nasal cannula oxygen  Post-op Assessment: Report given to RN and Post -op Vital signs reviewed and stable  Post vital signs: Reviewed and stable  Last Vitals:  Vitals:   11/04/16 0912 11/04/16 1123  BP: (!) 152/89 (!) 92/54  Pulse: 76 67  Resp: 20 (!) 7  Temp: (!) 35.9 C 36.2 C    Last Pain:  Vitals:   11/04/16 0912  TempSrc: Tympanic         Complications: No apparent anesthesia complications

## 2016-11-04 NOTE — Anesthesia Post-op Follow-up Note (Cosign Needed)
Anesthesia QCDR form completed.        

## 2016-11-11 ENCOUNTER — Ambulatory Visit (INDEPENDENT_AMBULATORY_CARE_PROVIDER_SITE_OTHER): Payer: BC Managed Care – PPO | Admitting: General Surgery

## 2016-11-11 ENCOUNTER — Encounter: Payer: Self-pay | Admitting: General Surgery

## 2016-11-11 VITALS — BP 132/74 | HR 80 | Resp 16 | Ht 70.0 in | Wt 162.0 lb

## 2016-11-11 DIAGNOSIS — K4090 Unilateral inguinal hernia, without obstruction or gangrene, not specified as recurrent: Secondary | ICD-10-CM

## 2016-11-11 NOTE — Anesthesia Postprocedure Evaluation (Signed)
Anesthesia Post Note  Patient: Jared Harmon  Procedure(s) Performed: Procedure(s) (LRB): HERNIA REPAIR INGUINAL ADULT (Right) INSERTION OF MESH  Patient location during evaluation: PACU Anesthesia Type: General Level of consciousness: awake and alert Pain management: pain level controlled Vital Signs Assessment: post-procedure vital signs reviewed and stable Respiratory status: spontaneous breathing, nonlabored ventilation, respiratory function stable and patient connected to nasal cannula oxygen Cardiovascular status: blood pressure returned to baseline and stable Postop Assessment: no signs of nausea or vomiting Anesthetic complications: no     Last Vitals:  Vitals:   11/04/16 1226 11/04/16 1317  BP: 137/63 (!) 148/72  Pulse:  74  Resp:  16  Temp:      Last Pain:  Vitals:   11/05/16 0850  TempSrc:   PainSc: 5                  Yevette EdwardsJames G Tambi Thole

## 2016-11-11 NOTE — Patient Instructions (Signed)
The patient is aware to call back for any questions or concerns.  

## 2016-11-11 NOTE — Progress Notes (Signed)
Patient ID: Jared Harmon, male   DOB: [birth year], 66 y.o.   MRN: [id number]  Chief Complaint  Patient presents with  . Routine Post Op    HPI Jared Harmon is a 66 y.o. male Here today for postoperative visit, he states he is doing well. Denies any gastrointestinal issues, bowels are moving regular. I have reviewed the history of present illness with the patient.   HPI  Past Medical History:  Diagnosis Date  . Acid reflux   . Atherosclerotic occlusive disease 2014   LE  . GERD (gastroesophageal reflux disease)   . Heart murmur    NEWLY DX BY PCP-SEEN BY CARDIOLOGIST DR Alvino Chapel JAN 2018-NOTE , ECHO IN EPIC  . Hyperlipidemia   . Hypertension     Past Surgical History:  Procedure Laterality Date  . COLONOSCOPY WITH PROPOFOL N/A 09/30/2016   Procedure: COLONOSCOPY WITH PROPOFOL;  Surgeon: Kieth Brightly, MD;  Location: [facility] ENDOSCOPY;  Service: Endoscopy;  Laterality: N/A;  . HERNIA REPAIR Left 2005  . INGUINAL HERNIA REPAIR Right 11/04/2016   Procedure: HERNIA REPAIR INGUINAL ADULT;  Surgeon: Kieth Brightly, MD;  Location: [facility] ORS;  Service: General;  Laterality: Right;  . INSERTION OF MESH  11/04/2016   Procedure: INSERTION OF MESH;  Surgeon: Kieth Brightly, MD;  Location: [facility] ORS;  Service: General;;  . VASCULAR SURGERY     ANGIOPLASTY AND STENTS IN EXTERNAL ILIAC ARTERIES BILATERAL  (DR [facility])    Family History  Problem Relation Age of Onset  . Stroke Father     Social History Social History  Substance Use Topics  . Smoking status: Current Every Day Smoker    Packs/day: 1.50    Years: 49.00  . Smokeless tobacco: Never Used  . Alcohol use No    No Known Allergies  Current Outpatient Prescriptions  Medication Sig Dispense Refill  . acetaminophen (TYLENOL) 500 MG tablet Take 1,500 mg by mouth every 8 (eight) hours as needed for mild pain or headache.     Marland Kitchen aspirin 81 MG tablet Take 81 mg by mouth daily.    Marland Kitchen atorvastatin (LIPITOR) 40  MG tablet Take 1 tablet (40 mg total) by mouth at bedtime. 90 tablet 1  . carvedilol (COREG) 12.5 MG tablet Take 1 tablet (12.5 mg total) by mouth daily. 90 tablet 1  . CVS VITAMIN B12 1000 MCG tablet Take 1,000 mcg by mouth daily. Reported on 02/20/2016  2  . gabapentin (NEURONTIN) 300 MG capsule Take 600 mg by mouth 3 (three) times daily.   6  . triamterene-hydrochlorothiazide (MAXZIDE) 75-50 MG tablet Take 1 tablet by mouth daily. 90 tablet 1  . Vitamin D, Ergocalciferol, (DRISDOL) 50000 units CAPS capsule Take 1 capsule (50,000 Units total) by mouth once a week. For 12 weeks 12 capsule 0   No current facility-administered medications for this visit.     Review of Systems Review of Systems  Constitutional: Negative.   Respiratory: Negative.   Cardiovascular: Negative.   Gastrointestinal: Negative.     Blood pressure 132/74, pulse 80, resp. rate 16, height 5\' 10"  (1.778 m), weight 162 lb (73.5 kg).  Physical Exam Physical Exam  Constitutional: He is oriented to person, place, and time. He appears well-developed and well-nourished.  Abdominal: Soft. Bowel sounds are normal.  Right inguinal incision intact with dermabond.   Neurological: He is alert and oriented to person, place, and time.  Skin: Skin is warm and dry.  Psychiatric: His behavior is normal.  Data Reviewed Prior notes   Assessment    Stable post op repair of right inguinal hernia with Progrip mesh.     Plan     Follow up in one month, May return to work November 25, 2016.  This information has been scribed by Dorathy DaftMarsha Hatch RN, BSN,BC.         Aanyah Loa G 11/11/2016, 2:07 PM

## 2016-11-17 ENCOUNTER — Ambulatory Visit: Payer: BC Managed Care – PPO | Admitting: General Surgery

## 2016-12-03 ENCOUNTER — Ambulatory Visit: Payer: BC Managed Care – PPO | Admitting: General Surgery

## 2016-12-09 ENCOUNTER — Encounter: Payer: Self-pay | Admitting: General Surgery

## 2016-12-09 ENCOUNTER — Ambulatory Visit (INDEPENDENT_AMBULATORY_CARE_PROVIDER_SITE_OTHER): Payer: BC Managed Care – PPO | Admitting: General Surgery

## 2016-12-09 VITALS — BP 158/72 | HR 68 | Resp 12 | Ht 70.0 in | Wt 165.0 lb

## 2016-12-09 DIAGNOSIS — K4090 Unilateral inguinal hernia, without obstruction or gangrene, not specified as recurrent: Secondary | ICD-10-CM

## 2016-12-09 NOTE — Patient Instructions (Signed)
The patient is aware to call back for any questions or concerns.  

## 2016-12-09 NOTE — Progress Notes (Signed)
Patient ID: Jared Harmon, male   DOB: [birth year], 66 y.o.   MRN: [id number]  Chief Complaint  Patient presents with  . Routine Post Op    HPI Jared Harmon is a 66 y.o. male.  Here today for his 5 week postoperative visit, he states he is doing well. Denies any gastrointestinal issues, bowels are moving regular. He states he is back at work and doing well. I have reviewed the history of present illness with the patient.  HPI  Past Medical History:  Diagnosis Date  . Acid reflux   . Atherosclerotic occlusive disease 2014   LE  . GERD (gastroesophageal reflux disease)   . Heart murmur    NEWLY DX BY PCP-SEEN BY CARDIOLOGIST DR Alvino Chapel JAN 2018-NOTE , ECHO IN EPIC  . Hyperlipidemia   . Hypertension     Past Surgical History:  Procedure Laterality Date  . COLONOSCOPY WITH PROPOFOL N/A 09/30/2016   Procedure: COLONOSCOPY WITH PROPOFOL;  Surgeon: Kieth Brightly, MD;  Location: [facility] ENDOSCOPY;  Service: Endoscopy;  Laterality: N/A;  . HERNIA REPAIR Left 2005  . INGUINAL HERNIA REPAIR Right 11/04/2016   Procedure: HERNIA REPAIR INGUINAL ADULT;  Surgeon: Kieth Brightly, MD;  Location: [facility] ORS;  Service: General;  Laterality: Right;  . INSERTION OF MESH  11/04/2016   Procedure: INSERTION OF MESH;  Surgeon: Kieth Brightly, MD;  Location: [facility] ORS;  Service: General;;  . VASCULAR SURGERY     ANGIOPLASTY AND STENTS IN EXTERNAL ILIAC ARTERIES BILATERAL  (DR [facility])    Family History  Problem Relation Age of Onset  . Stroke Father     Social History Social History  Substance Use Topics  . Smoking status: Current Every Day Smoker    Packs/day: 1.50    Years: 49.00  . Smokeless tobacco: Never Used  . Alcohol use No    No Known Allergies  Current Outpatient Prescriptions  Medication Sig Dispense Refill  . acetaminophen (TYLENOL) 500 MG tablet Take 1,500 mg by mouth every 8 (eight) hours as needed for mild pain or headache.     Marland Kitchen aspirin 81 MG tablet  Take 81 mg by mouth daily.    Marland Kitchen atorvastatin (LIPITOR) 40 MG tablet Take 1 tablet (40 mg total) by mouth at bedtime. 90 tablet 1  . carvedilol (COREG) 12.5 MG tablet Take 1 tablet (12.5 mg total) by mouth daily. 90 tablet 1  . [facility] VITAMIN B12 1000 MCG tablet Take 1,000 mcg by mouth daily. Reported on 02/20/2016  2  . gabapentin (NEURONTIN) 300 MG capsule Take 600 mg by mouth 3 (three) times daily.   6  . triamterene-hydrochlorothiazide (MAXZIDE) 75-50 MG tablet Take 1 tablet by mouth daily. 90 tablet 1  . Vitamin D, Ergocalciferol, (DRISDOL) 50000 units CAPS capsule Take 1 capsule (50,000 Units total) by mouth once a week. For 12 weeks 12 capsule 0   No current facility-administered medications for this visit.     Review of Systems Review of Systems  Constitutional: Negative.   Respiratory: Negative.   Cardiovascular: Negative.     Blood pressure (!) 158/72, pulse 68, resp. rate 12, height 5\' 10"  (1.778 m), weight 165 lb (74.8 kg).  Physical Exam Physical Exam  Constitutional: He is oriented to person, place, and time. He appears well-developed and well-nourished.  Abdominal: Soft. Normal appearance. There is no tenderness. No hernia.  right inguinal hernia incision clean and well healed.  Neurological: He is alert and oriented to person, place, and time.  Skin: Skin is warm and dry.  Psychiatric: His behavior is normal.    Data Reviewed Prior notes  Assessment    Intact repair of right inguinal hernia.     Plan    Follow up as needed. The patient is aware to call back for any questions or new concerns.     This information has been scribed by Dorathy DaftMarsha Hatch RN, BSN,BC.  Ailish Prospero G 12/09/2016, 9:33 AM

## 2016-12-11 ENCOUNTER — Ambulatory Visit: Payer: BC Managed Care – PPO | Admitting: General Surgery

## 2017-01-23 ENCOUNTER — Other Ambulatory Visit: Payer: Self-pay | Admitting: Family Medicine

## 2017-01-23 DIAGNOSIS — E559 Vitamin D deficiency, unspecified: Secondary | ICD-10-CM | POA: Insufficient documentation

## 2017-01-23 NOTE — Assessment & Plan Note (Signed)
Completed 12 weeks of Rx vit D; check level

## 2017-01-23 NOTE — Telephone Encounter (Signed)
It looks like Dr. Sherryll BurgerShah wanted patient to take the vitamin D prescription once a week for 12 weeks, then recheck level I'll decline the vitamin D Rx for now and put in orders for the Vit D level to be rechecked Please ask pt to come by some time in the next week or two to have this done Thank you

## 2017-02-08 ENCOUNTER — Other Ambulatory Visit: Payer: Self-pay | Admitting: Family Medicine

## 2017-02-13 ENCOUNTER — Other Ambulatory Visit: Payer: Self-pay | Admitting: Family Medicine

## 2017-02-13 DIAGNOSIS — E785 Hyperlipidemia, unspecified: Secondary | ICD-10-CM

## 2017-02-13 DIAGNOSIS — I10 Essential (primary) hypertension: Secondary | ICD-10-CM

## 2017-02-15 ENCOUNTER — Other Ambulatory Visit (INDEPENDENT_AMBULATORY_CARE_PROVIDER_SITE_OTHER): Payer: Self-pay | Admitting: Vascular Surgery

## 2017-02-16 ENCOUNTER — Other Ambulatory Visit: Payer: Self-pay | Admitting: Family Medicine

## 2017-02-16 DIAGNOSIS — I10 Essential (primary) hypertension: Secondary | ICD-10-CM

## 2017-02-18 ENCOUNTER — Ambulatory Visit (INDEPENDENT_AMBULATORY_CARE_PROVIDER_SITE_OTHER): Payer: BC Managed Care – PPO | Admitting: Family Medicine

## 2017-02-18 ENCOUNTER — Encounter: Payer: Self-pay | Admitting: Family Medicine

## 2017-02-18 VITALS — BP 141/73 | HR 85 | Temp 97.8°F | Resp 16 | Ht 70.0 in | Wt 158.5 lb

## 2017-02-18 DIAGNOSIS — I10 Essential (primary) hypertension: Secondary | ICD-10-CM

## 2017-02-18 DIAGNOSIS — E785 Hyperlipidemia, unspecified: Secondary | ICD-10-CM

## 2017-02-18 DIAGNOSIS — R7303 Prediabetes: Secondary | ICD-10-CM | POA: Diagnosis not present

## 2017-02-18 DIAGNOSIS — E559 Vitamin D deficiency, unspecified: Secondary | ICD-10-CM | POA: Diagnosis not present

## 2017-02-18 LAB — LIPID PANEL
CHOL/HDL RATIO: 3.2 ratio (ref <5.0)
Cholesterol: 115 mg/dL (ref <200)
HDL: 36 mg/dL — ABNORMAL LOW (ref >40)
LDL CALC: 64 mg/dL (ref <100)
Triglycerides: 76 mg/dL (ref <150)
VLDL: 15 mg/dL (ref <30)

## 2017-02-18 LAB — POCT GLYCOSYLATED HEMOGLOBIN (HGB A1C): HEMOGLOBIN A1C: 6

## 2017-02-18 MED ORDER — ATORVASTATIN CALCIUM 40 MG PO TABS: 40.0000 mg | ORAL_TABLET | Freq: Every day | ORAL | 1 refills | Status: AC

## 2017-02-18 MED ORDER — CARVEDILOL 12.5 MG PO TABS: 12.5000 mg | ORAL_TABLET | Freq: Every day | ORAL | 1 refills | Status: DC

## 2017-02-18 MED ORDER — TRIAMTERENE-HCTZ 75-50 MG PO TABS: 1.0000 | ORAL_TABLET | Freq: Every day | ORAL | 1 refills | Status: DC

## 2017-02-18 NOTE — Progress Notes (Signed)
Name: Jared HartshornDelbert M Harmon   MRN: [id number]    DOB: 08-12-1951   Date:02/18/2017       Progress Note  Subjective  Chief Complaint  Chief Complaint  Patient presents with  . Medication Refill    Hypertension  This is a chronic problem. The problem is unchanged. The problem is uncontrolled. Pertinent negatives include no blurred vision, chest pain, headaches, palpitations or shortness of breath. Past treatments include beta blockers and diuretics. There is no history of kidney disease, CAD/MI or CVA.  Hyperlipidemia  This is a chronic problem. The problem is uncontrolled. Recent lipid tests were reviewed and are high (elevated LDL). Pertinent negatives include no chest pain, leg pain, myalgias or shortness of breath. Current antihyperlipidemic treatment includes statins. Risk factors for coronary artery disease include dyslipidemia, male sex and hypertension.   Patient has finished a 90 day course of vitamin D, his levels were below normal at 14 ng per mL in January 2018, we will repeat today  Past Medical History:  Diagnosis Date  . Acid reflux   . Atherosclerotic occlusive disease 2014   LE  . GERD (gastroesophageal reflux disease)   . Heart murmur    NEWLY DX BY PCP-SEEN BY CARDIOLOGIST DR Alvino ChapelINGAL JAN 2018-NOTE , ECHO IN EPIC  . Hyperlipidemia   . Hypertension     Past Surgical History:  Procedure Laterality Date  . COLONOSCOPY WITH PROPOFOL N/A 09/30/2016   Procedure: COLONOSCOPY WITH PROPOFOL;  Surgeon: Kieth BrightlySeeplaputhur G Sankar, MD;  Location: [facility];  Service: Endoscopy;  Laterality: N/A;  . HERNIA REPAIR Left 2005  . INGUINAL HERNIA REPAIR Right 11/04/2016   Procedure: HERNIA REPAIR INGUINAL ADULT;  Surgeon: Kieth BrightlySeeplaputhur G Sankar, MD;  Location: [facility] OR[facility];  Service: General;  Laterality: Right;  . INSERTION OF MESH  11/04/2016   Procedure: INSERTION OF MESH;  Surgeon: Kieth BrightlySeeplaputhur G Sankar, MD;  Location: [facility] ORS;  Service: General;;  . VASCULAR SURGERY     ANGIOPLASTY AND  STENTS IN EXTERNAL ILIAC ARTERIES BILATERAL  ([facility])    Family History  Problem Relation Age of Onset  . Stroke Father     Social History   Social History  . Marital status: Married    Spouse name: N/A  . Number of children: N/A  . Years of education: N/A   Occupational History  . Not on file.   Social History Main Topics  . Smoking status: Current Every Day Smoker    Packs/day: 1.50    Years: 49.00  . Smokeless tobacco: Never Used  . Alcohol use No  . Drug use: No  . Sexual activity: Not on file   Other Topics Concern  . Not on file   Social History Narrative  . No narrative on file     Current Outpatient Prescriptions:  .  acetaminophen (TYLENOL) 500 MG tablet, Take 1,500 mg by mouth every 8 (eight) hours as needed for mild pain or headache. , Disp: , Rfl:  .  aspirin 81 MG tablet, Take 81 mg by mouth daily., Disp: , Rfl:  .  atorvastatin (LIPITOR) 40 MG tablet, Take 1 tablet (40 mg total) by mouth at bedtime., Disp: 90 tablet, Rfl: 1 .  carvedilol (COREG) 12.5 MG tablet, Take 1 tablet (12.5 mg total) by mouth daily., Disp: 90 tablet, Rfl: 1 .  CVS VITAMIN B12 1000 MCG tablet, Take 1,000 mcg by mouth daily. Reported on 02/20/2016, Disp: , Rfl: 2 .  gabapentin (NEURONTIN) 300 MG capsule, TAKE 1-2 CAPSULES BY  MOUTH 3 TIMES A DAY AS NEEDED, Disp: 540 capsule, Rfl: 2 .  triamterene-hydrochlorothiazide (MAXZIDE) 75-50 MG tablet, Take 1 tablet by mouth daily., Disp: 90 tablet, Rfl: 1  No Known Allergies   Review of Systems  Eyes: Negative for blurred vision.  Respiratory: Negative for shortness of breath.   Cardiovascular: Negative for chest pain and palpitations.  Musculoskeletal: Negative for myalgias.  Neurological: Negative for headaches.     Objective  Vitals:   02/18/17 0851  BP: (!) 141/73  Pulse: 85  Resp: 16  Temp: 97.8 F (36.6 C)  TempSrc: Oral  SpO2: 94%  Weight: 158 lb 8 oz (71.9 kg)  Height: 5\' 10"  (1.778 m)    Physical Exam   Constitutional: He is oriented to person, place, and time and well-developed, well-nourished, and in no distress.  HENT:  Head: Normocephalic and atraumatic.  Cardiovascular: Normal rate, regular rhythm and normal heart sounds.   No murmur heard. Pulmonary/Chest: Effort normal and breath sounds normal. He has no wheezes.  Abdominal: Soft. Bowel sounds are normal. There is no tenderness.  Musculoskeletal: He exhibits no edema.  Neurological: He is alert and oriented to person, place, and time.  Psychiatric: Mood, memory, affect and judgment normal.  Nursing note and vitals reviewed.       Assessment & Plan  1. Dyslipidemia Below normal HDL on FLP from January 2018, continue on statin - atorvastatin (LIPITOR) 40 MG tablet; Take 1 tablet (40 mg total) by mouth at bedtime.  Dispense: 90 tablet; Refill: 1 - Lipid panel  2. Essential hypertension BP likely elevated because he has been unable to take his coffee or Pepsi this morning, no change in pharmacotherapy - triamterene-hydrochlorothiazide (MAXZIDE) 75-50 MG tablet; Take 1 tablet by mouth daily.  Dispense: 90 tablet; Refill: 1 - carvedilol (COREG) 12.5 MG tablet; Take 1 tablet (12.5 mg total) by mouth daily.  Dispense: 90 tablet; Refill: 1  3. Vitamin D deficiency  - VITAMIN D 25 Hydroxy (Vit-D Deficiency, Fractures)  4. Prediabetes Point-of-care A1c 6.0%, consistent with prediabetes - POCT glycosylated hemoglobin (Hb A1C)   Jared Harmon Asad A. [facility] 02/18/2017 9:06 AM

## 2017-02-19 LAB — VITAMIN D 25 HYDROXY (VIT D DEFICIENCY, FRACTURES): Vit D, 25-Hydroxy: 49 ng/mL (ref 30–100)

## 2017-02-27 ENCOUNTER — Ambulatory Visit (INDEPENDENT_AMBULATORY_CARE_PROVIDER_SITE_OTHER): Payer: BC Managed Care – PPO | Admitting: Podiatry

## 2017-02-27 ENCOUNTER — Ambulatory Visit (INDEPENDENT_AMBULATORY_CARE_PROVIDER_SITE_OTHER): Payer: BC Managed Care – PPO

## 2017-02-27 ENCOUNTER — Encounter: Payer: Self-pay | Admitting: Podiatry

## 2017-02-27 DIAGNOSIS — Q828 Other specified congenital malformations of skin: Secondary | ICD-10-CM

## 2017-02-27 DIAGNOSIS — M216X1 Other acquired deformities of right foot: Secondary | ICD-10-CM

## 2017-02-27 DIAGNOSIS — R52 Pain, unspecified: Secondary | ICD-10-CM

## 2017-02-27 NOTE — Progress Notes (Signed)
   Subjective:    Patient ID: Jared Harmon, male    DOB: 1950/10/18, 66 y.o.   MRN: [id number]  HPI  66 year old male presents the office they for concerns of heart places to the bottom of his right foot and the balls of which make it very painful with pressure in shoes. He appears his try custom inserts but this did not help. He states that he does walk all day at work and he has to take a break at times because the pain to the ball the foot. He states that he does try endocrine areas talus does help. No recent injury or trauma. No swelling or redness. No other complaints.  Review of Systems  Gastrointestinal: Vomiting:    All other systems reviewed and are negative.      Objective:   Physical Exam General: AAO x3, NAD  Dermatological: Small punctate annular hyperkeratotic lesions present within the submetatarsal area the right foot with a larger lesion present submetatarsal to this with the majority of tenderness is present. There is no drainage, pus there is no surrounding redness or swelling. No open lesions identified.  Vascular: Dorsalis Pedis artery and Posterior Tibial artery pedal pulses are 2/4 bilateral with immedate capillary fill time.  There is no pain with calf compression, swelling, warmth, erythema.   Neruologic: Grossly intact via light touch bilateral.  Protective threshold with Semmes Wienstein monofilament intact to all pedal sites bilateral.   Musculoskeletal: Prominent metatarsal heads plantarly with atrophy of the fat pad. Muscular strength 5/5 in all groups tested bilateral.  Gait: Unassisted, Nonantalgic.      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 Upon the larger lesion submetatarsal to the area was cleaned and a pad was placed followed by  salicylic acid and a bandage. Post procedure instructions were discussed. -Metatarsal pads were dispensed. -Discussed shoe changes. -RTC 3-4 weeks or sooner if needed.  Ovid CurdMatthew Shreyansh Tiffany, DPM

## 2017-05-21 ENCOUNTER — Ambulatory Visit (INDEPENDENT_AMBULATORY_CARE_PROVIDER_SITE_OTHER): Payer: BC Managed Care – PPO | Admitting: Family Medicine

## 2017-05-21 ENCOUNTER — Encounter: Payer: Self-pay | Admitting: Family Medicine

## 2017-05-21 VITALS — BP 138/74 | HR 89 | Temp 98.4°F | Resp 17 | Ht 70.0 in | Wt 156.1 lb

## 2017-05-21 DIAGNOSIS — I10 Essential (primary) hypertension: Secondary | ICD-10-CM

## 2017-05-21 DIAGNOSIS — E785 Hyperlipidemia, unspecified: Secondary | ICD-10-CM | POA: Diagnosis not present

## 2017-05-21 DIAGNOSIS — Z1159 Encounter for screening for other viral diseases: Secondary | ICD-10-CM | POA: Diagnosis not present

## 2017-05-21 NOTE — Progress Notes (Signed)
Name: Jared Harmon   MRN: [id number]    DOB: 10-01-1950   Date:05/21/2017       Progress Note  Subjective  Chief Complaint  Chief Complaint  Patient presents with  . Follow-up    3 mo  . Hyperlipidemia    Hyperlipidemia  This is a chronic problem. The problem is controlled. Recent lipid tests were reviewed and are normal. Pertinent negatives include no chest pain, leg pain, myalgias or shortness of breath. Current antihyperlipidemic treatment includes statins. Risk factors for coronary artery disease include dyslipidemia, male sex and hypertension.  Hypertension  This is a chronic problem. The problem is unchanged. The problem is uncontrolled. Pertinent negatives include no blurred vision, chest pain, headaches, palpitations or shortness of breath. Past treatments include beta blockers and diuretics. There is no history of kidney disease, CAD/MI or CVA.      Past Medical History:  Diagnosis Date  . Acid reflux   . Atherosclerotic occlusive disease 2014   LE  . GERD (gastroesophageal reflux disease)   . Heart murmur    NEWLY DX BY PCP-SEEN BY CARDIOLOGIST DR Alvino Chapel JAN 2018-NOTE , ECHO IN EPIC  . Hyperlipidemia   . Hypertension     Past Surgical History:  Procedure Laterality Date  . COLONOSCOPY WITH PROPOFOL N/A 09/30/2016   Procedure: COLONOSCOPY WITH PROPOFOL;  Surgeon: Kieth Brightly, MD;  Location: [facility] END[facility];  Service: Endoscopy;  Laterality: N/A;  . HERNIA REPAIR Left 2005  . INGUINAL HERNIA REPAIR Right 11/04/2016   Procedure: HERNIA REPAIR INGUINAL ADULT;  Surgeon: Kieth Brightly, MD;  Location: [facility] ORS;  Service: General;  Laterality: Right;  . INSERTION OF MESH  11/04/2016   Procedure: INSERTION OF MESH;  Surgeon: Kieth Brightly, MD;  Location: [facility] ORS;  Service: General;;  . VASCULAR SURGERY     ANGIOPLASTY AND STENTS IN EXTERNAL ILIAC ARTERIES BILATERAL  (DR [facility])    Family History  Problem Relation Age of Onset  . Stroke Father      Social History   Social History  . Marital status: Married    Spouse name: N/A  . Number of children: N/A  . Years of education: N/A   Occupational History  . Not on file.   Social History Main Topics  . Smoking status: Current Every Day Smoker    Packs/day: 1.50    Years: 49.00  . Smokeless tobacco: Never Used  . Alcohol use No  . Drug use: No  . Sexual activity: Not on file   Other Topics Concern  . Not on file   Social History Narrative  . No narrative on file     Current Outpatient Prescriptions:  .  acetaminophen (TYLENOL) 500 MG tablet, Take 1,500 mg by mouth every 8 (eight) hours as needed for mild pain or headache. , Disp: , Rfl:  .  aspirin 81 MG tablet, Take 81 mg by mouth daily., Disp: , Rfl:  .  atorvastatin (LIPITOR) 40 MG tablet, Take 1 tablet (40 mg total) by mouth at bedtime., Disp: 90 tablet, Rfl: 1 .  carvedilol (COREG) 12.5 MG tablet, Take 1 tablet (12.5 mg total) by mouth daily., Disp: 90 tablet, Rfl: 1 .  CVS VITAMIN B12 1000 MCG tablet, Take 1,000 mcg by mouth daily. Reported on 02/20/2016, Disp: , Rfl: 2 .  gabapentin (NEURONTIN) 300 MG capsule, TAKE 1-2 CAPSULES BY MOUTH 3 TIMES A DAY AS NEEDED, Disp: 540 capsule, Rfl: 2 .  triamterene-hydrochlorothiazide (MAXZIDE) 75-50 MG tablet, Take  1 tablet by mouth daily., Disp: 90 tablet, Rfl: 1  No Known Allergies   Review of Systems  Eyes: Negative for blurred vision.  Respiratory: Negative for shortness of breath.   Cardiovascular: Negative for chest pain and palpitations.  Musculoskeletal: Negative for myalgias.  Neurological: Negative for headaches.     Objective  Vitals:   05/21/17 0848  BP: 138/74  Pulse: 89  Resp: 17  Temp: 98.4 F (36.9 C)  TempSrc: Oral  SpO2: 93%  Weight: 156 lb 1.6 oz (70.8 kg)  Height: 5\' 10"  (1.778 m)    Physical Exam  Constitutional: He is oriented to person, place, and time and well-developed, well-nourished, and in no distress.  HENT:  Head:  Normocephalic and atraumatic.  Cardiovascular: Normal rate, regular rhythm and normal heart sounds.   No murmur heard. Pulmonary/Chest: Effort normal and breath sounds normal. He has no wheezes.  Neurological: He is alert and oriented to person, place, and time.  Psychiatric: Mood, memory, affect and judgment normal.  Nursing note and vitals reviewed.     Assessment & Plan  1. Dyslipidemia FLP at goal except for below normal HDL, continue on statin, recheck in 3 months  2. Essential hypertension BP stable on present hypertensive treatment  3. Need for hepatitis C screening test  - Hepatitis C antibody   Phillp Dolores Asad A. Faylene KurtzShah [facility] 05/21/2017 8:53 AM

## 2017-05-22 LAB — HEPATITIS C ANTIBODY
Hepatitis C Ab: NONREACTIVE
SIGNAL TO CUT-OFF: 0.06 (ref <1.00)

## 2017-06-09 ENCOUNTER — Other Ambulatory Visit (INDEPENDENT_AMBULATORY_CARE_PROVIDER_SITE_OTHER): Payer: Self-pay | Admitting: Vascular Surgery

## 2017-06-09 DIAGNOSIS — I739 Peripheral vascular disease, unspecified: Secondary | ICD-10-CM

## 2017-06-15 ENCOUNTER — Encounter (INDEPENDENT_AMBULATORY_CARE_PROVIDER_SITE_OTHER): Payer: Self-pay | Admitting: Vascular Surgery

## 2017-06-15 ENCOUNTER — Ambulatory Visit (INDEPENDENT_AMBULATORY_CARE_PROVIDER_SITE_OTHER): Payer: BC Managed Care – PPO

## 2017-06-15 ENCOUNTER — Encounter (INDEPENDENT_AMBULATORY_CARE_PROVIDER_SITE_OTHER): Payer: BC Managed Care – PPO

## 2017-06-15 ENCOUNTER — Ambulatory Visit (INDEPENDENT_AMBULATORY_CARE_PROVIDER_SITE_OTHER): Payer: BC Managed Care – PPO | Admitting: Vascular Surgery

## 2017-06-15 VITALS — BP 166/66 | HR 78 | Resp 17 | Ht 70.0 in | Wt 159.2 lb

## 2017-06-15 DIAGNOSIS — I10 Essential (primary) hypertension: Secondary | ICD-10-CM | POA: Diagnosis not present

## 2017-06-15 DIAGNOSIS — I6529 Occlusion and stenosis of unspecified carotid artery: Secondary | ICD-10-CM | POA: Insufficient documentation

## 2017-06-15 DIAGNOSIS — I739 Peripheral vascular disease, unspecified: Secondary | ICD-10-CM | POA: Diagnosis not present

## 2017-06-15 DIAGNOSIS — I6523 Occlusion and stenosis of bilateral carotid arteries: Secondary | ICD-10-CM | POA: Diagnosis not present

## 2017-06-15 DIAGNOSIS — G629 Polyneuropathy, unspecified: Secondary | ICD-10-CM | POA: Diagnosis not present

## 2017-06-15 DIAGNOSIS — I70213 Atherosclerosis of native arteries of extremities with intermittent claudication, bilateral legs: Secondary | ICD-10-CM | POA: Diagnosis not present

## 2017-06-15 DIAGNOSIS — I70219 Atherosclerosis of native arteries of extremities with intermittent claudication, unspecified extremity: Secondary | ICD-10-CM | POA: Insufficient documentation

## 2017-06-15 NOTE — Progress Notes (Signed)
MRN : [id number]  Jared Harmon is a 66 y.o. (1950/10/01) male who presents with chief complaint of  Chief Complaint  Patient presents with  . Follow-up    1 yr abi  .  History of Present Illness:   The patient returns to the office for followup and review of the noninvasive studies. There have been no interval changes in lower extremity symptoms. No interval shortening of the patient's claudication distance or development of rest pain symptoms. No new ulcers or wounds have occurred since the last visit.  The patient also has known carotid stenosis which is followed with duplex ultrasound The patient denies amaurosis fugax or recent TIA symptoms. There are no recent neurological changes noted.  There have been no significant changes to the patient's overall health care.  The patient denies history of DVT, PE or superficial thrombophlebitis. The patient denies recent episodes of angina or shortness of breath.  Studies Today: ABI's Rt=0.86 and Lt=0.84  Previous Studies: Duplex US of the carotid arteries: RICA and LICA <50% no change compared to last study ABI's Rt=0.76 and Lt=0.72 Duplex ultrasound of the lower extremity arterial system shows diffuse disease throughout with biphasic signal at the ankle level  Current Meds  Medication Sig  . acetaminophen (TYLENOL) 500 MG tablet Take 1,500 mg by mouth every 8 (eight) hours as needed for mild pain or headache.   Marland Kitchen aspirin 81 MG tablet Take 81 mg by mouth daily.  Marland Kitchen atorvastatin (LIPITOR) 40 MG tablet Take 1 tablet (40 mg total) by mouth at bedtime.  . carvedilol (COREG) 12.5 MG tablet Take 1 tablet (12.5 mg total) by mouth daily.  . CVS VITAMIN B12 1000 MCG tablet Take 1,000 mcg by mouth daily. Reported on 02/20/2016  . gabapentin (NEURONTIN) 300 MG capsule TAKE 1-2 CAPSULES BY MOUTH 3 TIMES A DAY AS NEEDED  . triamterene-hydrochlorothiazide (MAXZIDE) 75-50 MG tablet Take 1 tablet by mouth daily.    Past Medical History:    Diagnosis Date  . Acid reflux   . Atherosclerotic occlusive disease 2014   LE  . GERD (gastroesophageal reflux disease)   . Heart murmur    NEWLY DX BY PCP-SEEN BY CARDIOLOGIST DR Alvino Chapel JAN 2018-NOTE , ECHO IN EPIC  . Hyperlipidemia   . Hypertension     Past Surgical History:  Procedure Laterality Date  . COLONOSCOPY WITH PROPOFOL N/A 09/30/2016   Procedure: COLONOSCOPY WITH PROPOFOL;  Surgeon: Kieth Brightly, MD;  Location: [facility];  Service: Endoscopy;  Laterality: N/A;  . HERNIA REPAIR Left 2005  . INGUINAL HERNIA REPAIR Right 11/04/2016   Procedure: HERNIA REPAIR INGUINAL ADULT;  Surgeon: Kieth Brightly, MD;  Location: [facility] ORS;  Service: General;  Laterality: Right;  . INSERTION OF MESH  11/04/2016   Procedure: INSERTION OF MESH;  Surgeon: Kieth Brightly, MD;  Location: [facility] ORS;  Service: General;;  . VASCULAR SURGERY     ANGIOPLASTY AND STENTS IN EXTERNAL ILIAC ARTERIES BILATERAL  (DR [facility])    Social History Social History  Substance Use Topics  . Smoking status: Current Every Day Smoker    Packs/day: 1.50    Years: 49.00  . Smokeless tobacco: Never Used  . Alcohol use No    Family History Family History  Problem Relation Age of Onset  . Stroke Father     No Known Allergies   REVIEW OF SYSTEMS (Negative unless checked)  Constitutional: Weight loss  Fever  Chills Cardiac: Chest pain   Chest pressure     Palpitations   Shortness of breath when laying flat   Shortness of breath with exertion. Vascular:  Pain in legs with walking   Pain in legs at rest  History of DVT   Phlebitis   Swelling in legs   Varicose veins   Non-healing ulcers Pulmonary:   Uses home oxygen   Productive cough   Hemoptysis   Wheeze  COPD   Asthma Neurologic:  Dizziness   Seizures   History of stroke   History of TIA  Aphasia   Vissual changes   Weakness or numbness in arm   Weakness or numbness in  leg Musculoskeletal:   Joint swelling   Joint pain   Low back pain Hematologic:  Easy bruising  Easy bleeding   Hypercoagulable state   Anemic Gastrointestinal:  Diarrhea   Vomiting  Gastroesophageal reflux/heartburn   Difficulty swallowing. Genitourinary:  Chronic kidney disease   Difficult urination  Frequent urination   Blood in urine Skin:  Rashes   Ulcers  Psychological:  History of anxiety    History of major depression.  Physical Examination  Vitals:   06/15/17 0840  BP: (!) 166/66  Pulse: 78  Resp: 17  Weight: 72.2 kg (159 lb 3.2 oz)  Height:  (1.778 m)   Body mass index is 22.84 kg/m. Gen: WD/WN, NAD Head: [place]/AT, No temporalis wasting.  Ear/Nose/Throat: Hearing grossly intact, nares w/o erythema or drainage Eyes: PER, EOMI, sclera nonicteric.  Neck: Supple, no large masses.   Pulmonary:  Good air movement, no audible wheezing bilaterally, no use of accessory muscles.  Cardiac: RRR, no JVD Vascular:  No carotid bruits noted Vessel Right Left  Radial Palpable Palpable  Carotid Palpable Palpable  Popliteal Not Palpable Not Palpable  PT Not Palpable Not Palpable  DP Not Palpable Not Palpable  Gastrointestinal: Non-distended. No guarding/no peritoneal signs.  Musculoskeletal: M/S 5/5 throughout.  No deformity or atrophy.  Neurologic: CN 2-12 intact. Symmetrical.  Speech is fluent. Motor exam as listed above. Psychiatric: Judgment intact, Mood & affect appropriate for pt's clinical situation. Dermatologic: No rashes or ulcers noted.  No changes consistent with cellulitis. Lymph : No lichenification or skin changes of chronic lymphedema.  CBC Lab Results  Component Value Date   WBC 6.4 10/10/2016   HGB 14.2 10/10/2016   HCT 41.7 10/10/2016   MCV 100.5 (H) 10/10/2016   PLT 263 10/10/2016    BMET    Component Value Date/Time   NA 136 10/10/2016 0957   NA 137 02/20/2016 1006   K 4.2 10/10/2016 0957   CL 97 (L)  10/10/2016 0957   CO2 33 (H) 10/10/2016 0957   GLUCOSE 107 (H) 10/10/2016 0957   BUN 13 10/10/2016 0957   BUN 10 02/20/2016 1006   BUN 7 03/08/2013 0913   CREATININE 0.89 10/10/2016 0957   CALCIUM 9.8 10/10/2016 0957   GFRNONAA 89 10/10/2016 0957   GFRAA >89 10/10/2016 0957   CrCl cannot be calculated (Patient's most recent lab result is older than the maximum 21 days allowed.).  COAG No results found for: INR, PROTIME  Radiology No results found.   Assessment/Plan 1. Atherosclerosis of native artery of both lower extremities with intermittent claudication (HCC)  Recommend:  The patient has evidence of atherosclerosis of the lower extremities with claudication.  The patient does not voice lifestyle limiting changes at this point in time.  Noninvasive studies do not suggest clinically significant change.  No invasive studies, angiography or surgery at this time The patient  should continue walking and begin a more formal exercise program.  The patient should continue antiplatelet therapy and aggressive treatment of the lipid abnormalities  No changes in the patient's medications at this time  The patient should continue wearing graduated compression socks 10-15 mmHg strength to control the mild edema.    2. Bilateral carotid artery stenosis Recommend:  Given the patient's asymptomatic subcritical stenosis no further invasive testing or surgery at this time.  Continue antiplatelet therapy as prescribed Continue management of CAD, HTN and Hyperlipidemia Healthy heart diet,  encouraged exercise at least 4 times per week Follow up in 12 months with duplex ultrasound and physical exam   3. Essential hypertension Continue antihypertensive medications as already ordered, these medications have been reviewed and there are no changes at this time.   4. Neuropathy Continue Neurontin    Levora Dredge, MD  06/15/2017 9:12 AM

## 2017-08-21 ENCOUNTER — Other Ambulatory Visit: Payer: Self-pay | Admitting: Family Medicine

## 2017-08-21 ENCOUNTER — Telehealth: Payer: Self-pay | Admitting: Family Medicine

## 2017-08-21 DIAGNOSIS — I10 Essential (primary) hypertension: Secondary | ICD-10-CM

## 2017-08-21 NOTE — Telephone Encounter (Signed)
Copied from CRM [phone]. Topic: Quick Communication - Rx Refill/Question >> Aug 21, 2017  8:16 AM Gerrianne ScalePayne, Amilee Janvier L wrote: Has the patient contacted their pharmacy? Yes.     (Agent: If no, request that the patient contact the pharmacy for the refill.)  Refill on triamterene-hydrochlorothiazide (MAXZIDE) 75-50 MG tablet pt is out of medicine states that pharmacy faxed a request out few days ago   Preferred Pharmacy (with phone number or street name): [facility] [place]   Agent: Please be advised that RX refills may take up to 3 business days. We ask that you follow-up with your pharmacy.

## 2017-08-24 NOTE — Telephone Encounter (Signed)
Requesting refill on triamterene-hydrochlorothiazide (Maxzide) please.

## 2017-08-25 MED ORDER — TRIAMTERENE-HCTZ 75-50 MG PO TABS: 1.0000 | ORAL_TABLET | Freq: Every day | ORAL | 1 refills | Status: DC

## 2017-08-25 NOTE — Telephone Encounter (Signed)
Prescription for triamterene/HCTZ has been sent to patient's pharmacy

## 2017-08-25 NOTE — Telephone Encounter (Signed)
LVM for patient informing him that RX has been sent to pharmacy.

## 2017-09-01 ENCOUNTER — Other Ambulatory Visit: Payer: Self-pay | Admitting: Family Medicine

## 2017-09-01 DIAGNOSIS — I10 Essential (primary) hypertension: Secondary | ICD-10-CM

## 2017-11-11 ENCOUNTER — Ambulatory Visit: Payer: BC Managed Care – PPO | Admitting: Family Medicine

## 2017-11-11 ENCOUNTER — Other Ambulatory Visit: Payer: Self-pay

## 2017-11-11 ENCOUNTER — Encounter: Payer: Self-pay | Admitting: Family Medicine

## 2017-11-11 ENCOUNTER — Emergency Department: Payer: BC Managed Care – PPO

## 2017-11-11 ENCOUNTER — Encounter: Payer: Self-pay | Admitting: Emergency Medicine

## 2017-11-11 ENCOUNTER — Inpatient Hospital Stay: Payer: BC Managed Care – PPO

## 2017-11-11 ENCOUNTER — Inpatient Hospital Stay
Admission: EM | Admit: 2017-11-11 | Discharge: 2017-11-12 | DRG: 871 | Disposition: A | Discharge status: Home / Self Care | Payer: BC Managed Care – PPO | Attending: Internal Medicine | Admitting: Internal Medicine

## 2017-11-11 VITALS — BP 134/86 | HR 106 | Temp 100.1°F | Resp 18 | Ht 70.0 in | Wt 154.4 lb

## 2017-11-11 DIAGNOSIS — E785 Hyperlipidemia, unspecified: Secondary | ICD-10-CM | POA: Diagnosis present

## 2017-11-11 DIAGNOSIS — A419 Sepsis, unspecified organism: Secondary | ICD-10-CM | POA: Diagnosis present

## 2017-11-11 DIAGNOSIS — Z20828 Contact with and (suspected) exposure to other viral communicable diseases: Secondary | ICD-10-CM | POA: Diagnosis present

## 2017-11-11 DIAGNOSIS — R5383 Other fatigue: Secondary | ICD-10-CM | POA: Diagnosis not present

## 2017-11-11 DIAGNOSIS — J440 Chronic obstructive pulmonary disease with acute lower respiratory infection: Secondary | ICD-10-CM | POA: Diagnosis present

## 2017-11-11 DIAGNOSIS — I739 Peripheral vascular disease, unspecified: Secondary | ICD-10-CM | POA: Diagnosis present

## 2017-11-11 DIAGNOSIS — E871 Hypo-osmolality and hyponatremia: Secondary | ICD-10-CM | POA: Diagnosis present

## 2017-11-11 DIAGNOSIS — I70209 Unspecified atherosclerosis of native arteries of extremities, unspecified extremity: Secondary | ICD-10-CM | POA: Diagnosis present

## 2017-11-11 DIAGNOSIS — J441 Chronic obstructive pulmonary disease with (acute) exacerbation: Secondary | ICD-10-CM | POA: Diagnosis present

## 2017-11-11 DIAGNOSIS — Z7982 Long term (current) use of aspirin: Secondary | ICD-10-CM

## 2017-11-11 DIAGNOSIS — F17210 Nicotine dependence, cigarettes, uncomplicated: Secondary | ICD-10-CM | POA: Diagnosis present

## 2017-11-11 DIAGNOSIS — E876 Hypokalemia: Secondary | ICD-10-CM | POA: Diagnosis present

## 2017-11-11 DIAGNOSIS — I10 Essential (primary) hypertension: Secondary | ICD-10-CM | POA: Diagnosis present

## 2017-11-11 DIAGNOSIS — R2681 Unsteadiness on feet: Secondary | ICD-10-CM

## 2017-11-11 DIAGNOSIS — T502X5A Adverse effect of carbonic-anhydrase inhibitors, benzothiadiazides and other diuretics, initial encounter: Secondary | ICD-10-CM | POA: Diagnosis present

## 2017-11-11 DIAGNOSIS — R509 Fever, unspecified: Secondary | ICD-10-CM | POA: Diagnosis not present

## 2017-11-11 DIAGNOSIS — R52 Pain, unspecified: Secondary | ICD-10-CM

## 2017-11-11 DIAGNOSIS — R2689 Other abnormalities of gait and mobility: Secondary | ICD-10-CM | POA: Diagnosis not present

## 2017-11-11 DIAGNOSIS — J189 Pneumonia, unspecified organism: Secondary | ICD-10-CM | POA: Diagnosis present

## 2017-11-11 DIAGNOSIS — E860 Dehydration: Secondary | ICD-10-CM | POA: Diagnosis present

## 2017-11-11 DIAGNOSIS — Z79899 Other long term (current) drug therapy: Secondary | ICD-10-CM

## 2017-11-11 DIAGNOSIS — Z95820 Peripheral vascular angioplasty status with implants and grafts: Secondary | ICD-10-CM

## 2017-11-11 LAB — URINALYSIS, COMPLETE (UACMP) WITH MICROSCOPIC
Bacteria, UA: NONE SEEN
Bilirubin Urine: NEGATIVE
Glucose, UA: NEGATIVE mg/dL
Hgb urine dipstick: NEGATIVE
Ketones, ur: NEGATIVE mg/dL
Leukocytes, UA: NEGATIVE
Nitrite: NEGATIVE
PROTEIN: NEGATIVE mg/dL
SQUAMOUS EPITHELIAL / LPF: NONE SEEN
Specific Gravity, Urine: 1.008 (ref 1.005–1.030)
pH: 7 (ref 5.0–8.0)

## 2017-11-11 LAB — COMPREHENSIVE METABOLIC PANEL
ALT: 31 U/L (ref 17–63)
ANION GAP: 12 (ref 5–15)
AST: 50 U/L — ABNORMAL HIGH (ref 15–41)
Albumin: 4 g/dL (ref 3.5–5.0)
Alkaline Phosphatase: 74 U/L (ref 38–126)
BUN: 19 mg/dL (ref 6–20)
CHLORIDE: 84 mmol/L — AB (ref 101–111)
CO2: 27 mmol/L (ref 22–32)
CREATININE: 1.08 mg/dL (ref 0.61–1.24)
Calcium: 8.7 mg/dL — ABNORMAL LOW (ref 8.9–10.3)
GFR calc non Af Amer: >60 mL/min (ref >60)
Glucose, Bld: 141 mg/dL — ABNORMAL HIGH (ref 65–99)
Potassium: 2.9 mmol/L — ABNORMAL LOW (ref 3.5–5.1)
Sodium: 123 mmol/L — ABNORMAL LOW (ref 135–145)
Total Bilirubin: 0.5 mg/dL (ref 0.3–1.2)
Total Protein: 7.2 g/dL (ref 6.5–8.1)

## 2017-11-11 LAB — CBC WITH DIFFERENTIAL/PLATELET
Basophils Absolute: 0 10*3/uL (ref 0–0.1)
Basophils Relative: 0 %
Eosinophils Absolute: 0 10*3/uL (ref 0–0.7)
Eosinophils Relative: 0 %
HEMATOCRIT: 37.6 % — AB (ref 40.0–52.0)
HEMOGLOBIN: 13.4 g/dL (ref 13.0–18.0)
LYMPHS ABS: 0.3 10*3/uL — AB (ref 1.0–3.6)
Lymphocytes Relative: 8 %
MCH: 35.1 pg — AB (ref 26.0–34.0)
MCHC: 35.6 g/dL (ref 32.0–36.0)
MCV: 98.7 fL (ref 80.0–100.0)
MONOS PCT: 4 %
Monocytes Absolute: 0.1 10*3/uL — ABNORMAL LOW (ref 0.2–1.0)
NEUTROS ABS: 2.9 10*3/uL (ref 1.4–6.5)
NEUTROS PCT: 88 %
Platelets: 148 10*3/uL — ABNORMAL LOW (ref 150–440)
RBC: 3.81 MIL/uL — ABNORMAL LOW (ref 4.40–5.90)
RDW: 11.9 % (ref 11.5–14.5)
WBC: 3.3 10*3/uL — ABNORMAL LOW (ref 3.8–10.6)

## 2017-11-11 LAB — APTT: APTT: 36 s (ref 24–36)

## 2017-11-11 LAB — TROPONIN I: Troponin I: <0.03 ng/mL (ref <0.03)

## 2017-11-11 LAB — POCT INFLUENZA A/B
INFLUENZA A, POC: NEGATIVE
Influenza B, POC: NEGATIVE

## 2017-11-11 LAB — LACTIC ACID, PLASMA: Lactic Acid, Venous: 0.7 mmol/L (ref 0.5–1.9)

## 2017-11-11 LAB — PROTIME-INR
INR: 1.03
PROTHROMBIN TIME: 13.4 s (ref 11.4–15.2)

## 2017-11-11 LAB — PROCALCITONIN: PROCALCITONIN: 0.62 ng/mL

## 2017-11-11 MED ORDER — CARVEDILOL 3.125 MG PO TABS: 12.5000 mg | ORAL_TABLET | Freq: Every day | ORAL | Status: DC

## 2017-11-11 MED ORDER — ASPIRIN EC 81 MG PO TBEC
81.0000 mg | DELAYED_RELEASE_TABLET | Freq: Every day | ORAL | Status: DC
  Administered 2017-11-12: 11:00:00 81 mg via ORAL
  Filled 2017-11-11 (×2): qty 1

## 2017-11-11 MED ORDER — NICOTINE 14 MG/24HR TD PT24
14.0000 mg | MEDICATED_PATCH | Freq: Every day | TRANSDERMAL | Status: DC
  Filled 2017-11-11: qty 1

## 2017-11-11 MED ORDER — IPRATROPIUM-ALBUTEROL 0.5-2.5 (3) MG/3ML IN SOLN
3.0000 mL | Freq: Four times a day (QID) | RESPIRATORY_TRACT | Status: DC
  Administered 2017-11-12: 3 mL via RESPIRATORY_TRACT
  Filled 2017-11-11 (×2): qty 3

## 2017-11-11 MED ORDER — PIPERACILLIN-TAZOBACTAM 3.375 G IVPB
3.3750 g | Freq: Three times a day (TID) | INTRAVENOUS | Status: DC
  Administered 2017-11-11 – 2017-11-12 (×2): 3.375 g via INTRAVENOUS
  Filled 2017-11-11 (×2): qty 50

## 2017-11-11 MED ORDER — ALBUTEROL SULFATE (2.5 MG/3ML) 0.083% IN NEBU
2.5000 mg | INHALATION_SOLUTION | RESPIRATORY_TRACT | Status: DC | PRN
Start: 2017-11-11 — End: 2017-11-12

## 2017-11-11 MED ORDER — SODIUM CHLORIDE 0.9 % IV BOLUS (SEPSIS)
1000.0000 mL | Freq: Once | INTRAVENOUS | Status: AC
  Administered 2017-11-11: 1000 mL via INTRAVENOUS

## 2017-11-11 MED ORDER — ACETAMINOPHEN 650 MG RE SUPP: 650.0000 mg | Freq: Four times a day (QID) | RECTAL | Status: DC | PRN

## 2017-11-11 MED ORDER — PNEUMOCOCCAL VAC POLYVALENT 25 MCG/0.5ML IJ INJ: 0.5000 mL | INJECTION | INTRAMUSCULAR | Status: DC

## 2017-11-11 MED ORDER — LEVOFLOXACIN IN D5W 750 MG/150ML IV SOLN
750.0000 mg | INTRAVENOUS | Status: DC
  Administered 2017-11-11: 22:00:00 750 mg via INTRAVENOUS
  Filled 2017-11-11: qty 150

## 2017-11-11 MED ORDER — POTASSIUM CHLORIDE CRYS ER 20 MEQ PO TBCR
40.0000 meq | EXTENDED_RELEASE_TABLET | ORAL | Status: AC
  Administered 2017-11-11 (×2): 40 meq via ORAL
  Filled 2017-11-11 (×2): qty 2

## 2017-11-11 MED ORDER — ENOXAPARIN SODIUM 40 MG/0.4ML ~~LOC~~ SOLN: 40.0000 mg | SUBCUTANEOUS | Status: DC

## 2017-11-11 MED ORDER — VANCOMYCIN HCL IN DEXTROSE 1-5 GM/200ML-% IV SOLN
1000.0000 mg | INTRAVENOUS | Status: AC
  Administered 2017-11-11: 23:00:00 1000 mg via INTRAVENOUS
  Filled 2017-11-11: qty 200

## 2017-11-11 MED ORDER — ACETAMINOPHEN 325 MG PO TABS
650.0000 mg | ORAL_TABLET | Freq: Four times a day (QID) | ORAL | Status: DC | PRN
  Filled 2017-11-11: qty 2

## 2017-11-11 MED ORDER — ONDANSETRON HCL 4 MG/2ML IJ SOLN: 4.0000 mg | Freq: Four times a day (QID) | INTRAMUSCULAR | Status: DC | PRN

## 2017-11-11 MED ORDER — ONDANSETRON HCL 4 MG PO TABS: 4.0000 mg | ORAL_TABLET | Freq: Four times a day (QID) | ORAL | Status: DC | PRN

## 2017-11-11 MED ORDER — VITAMIN B-12 1000 MCG PO TABS
1000.0000 ug | ORAL_TABLET | Freq: Every day | ORAL | Status: DC
  Administered 2017-11-12: 11:00:00 1000 ug via ORAL
  Filled 2017-11-11: qty 1

## 2017-11-11 MED ORDER — ATORVASTATIN CALCIUM 20 MG PO TABS
40.0000 mg | ORAL_TABLET | Freq: Every day | ORAL | Status: DC
  Administered 2017-11-11: 20:00:00 40 mg via ORAL
  Filled 2017-11-11: qty 2

## 2017-11-11 MED ORDER — POTASSIUM CHLORIDE IN NACL 40-0.9 MEQ/L-% IV SOLN
INTRAVENOUS | Status: DC
  Administered 2017-11-11 – 2017-11-12 (×3): 100 mL/h via INTRAVENOUS
  Filled 2017-11-11 (×4): qty 1000

## 2017-11-11 MED ORDER — POTASSIUM CHLORIDE 10 MEQ/100ML IV SOLN
10.0000 meq | Freq: Once | INTRAVENOUS | Status: AC
  Administered 2017-11-11: 10 meq via INTRAVENOUS
  Filled 2017-11-11: qty 100

## 2017-11-11 NOTE — Progress Notes (Signed)
Call for fevers of 103, tachycardia, hypotension, patient with productive cough, smoker, CT chest noted, influenza negative, urinalysis negative symptom complex concerning for sepsis-we will start sepsis protocol, check blood cultures, empiric Levaquin for now, scheduled breathing treatments, respiratory therapy to see as this may represent acute bronchitis versus COPD exacerbation

## 2017-11-11 NOTE — ED Provider Notes (Signed)
[facility] Emergency Department Provider Note ____________________________________________   First MD Initiated Contact with Patient 11/11/17 1129     (approximate)  I have reviewed the triage vital signs and the nursing notes.   HISTORY  Chief Complaint Weakness    HPI Jared Harmon is a 67 y.o. male with past medical history as noted below who presents with generalized weakness over the last week, gradual onset, described as feeling wobbly and unsteady on his feet, associated with decreased appetite, body aches, chills, fever, and generalized headache.  The patient denies any associated chest pain, diarrhea, difficulty breathing, or urinary symptoms.  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  He was at his family practice today for evaluation.  Influenza POC test was negative, and he was sent to the emergency department for further evaluation.  Past Medical History:  Diagnosis Date  . Acid reflux   . Atherosclerotic occlusive disease 2014   LE  . GERD (gastroesophageal reflux disease)   . Heart murmur    NEWLY DX BY PCP-SEEN BY CARDIOLOGIST DR Alvino Chapel JAN 2018-NOTE , ECHO IN EPIC  . Hyperlipidemia   . Hypertension     Patient Active Problem List   Diagnosis Date Noted  . Atherosclerosis of native arteries of extremity with intermittent claudication (HCC) 06/15/2017  . Carotid stenosis 06/15/2017  . Vitamin D deficiency 01/23/2017  . Annual physical exam 10/10/2016  . Hearing loss due to cerumen impaction 10/10/2016  . Inguinal hernia of right side without obstruction or gangrene 09/09/2016  . Cardiac murmur 08/21/2016  . Hyperglycemia 02/20/2016  . Observed seizure-like activity (HCC) 07/30/2015  . DDD (degenerative disc disease), cervical 07/30/2015  . Neurogenic claudication 07/30/2015  . Neuropathy 07/30/2015  . Hypertension 04/27/2015  . Dyslipidemia 04/27/2015  .  B12 deficiency 04/27/2015  . Fatigue 04/27/2015  . Lumbar radiculopathy 04/27/2015  . Peripheral neuropathic pain 04/27/2015    Past Surgical History:  Procedure Laterality Date  . COLONOSCOPY WITH PROPOFOL N/A 09/30/2016   Procedure: COLONOSCOPY WITH PROPOFOL;  Surgeon: Kieth Brightly, MD;  Location: [facility] ENDOSCOPY;  Service: Endoscopy;  Laterality: N/A;  . HERNIA REPAIR Left 2005  . INGUINAL HERNIA REPAIR Right 11/04/2016   Procedure: HERNIA REPAIR INGUINAL ADULT;  Surgeon: Kieth Brightly, MD;  Location: [facility] ORS;  Service: General;  Laterality: Right;  . INSERTION OF MESH  11/04/2016   Procedure: INSERTION OF MESH;  Surgeon: Kieth Brightly, MD;  Location: [facility] ORS;  Service: General;;  . VASCULAR SURGERY     ANGIOPLASTY AND STENTS IN EXTERNAL ILIAC ARTERIES BILATERAL  (DR [facility])    Prior to Admission medications   Medication Sig Start Date End Date Taking? Authorizing Provider  acetaminophen (TYLENOL) 500 MG tablet Take 1,500 mg by mouth every 8 (eight) hours as needed for mild pain or headache.     [provider]  aspirin 81 MG tablet Take 81 mg by mouth daily.    [provider]  atorvastatin (LIPITOR) 40 MG tablet Take 1 tablet (40 mg total) by mouth at bedtime. 02/18/17   Ellyn Hack, MD  carvedilol (COREG) 12.5 MG tablet TAKE 1 TABLET BY MOUTH EVERY DAY 09/03/17   Ellyn Hack, MD  [facility] VITAMIN B12 1000 MCG tablet Take 1,000 mcg by mouth daily. Reported on 02/20/2016 12/12/14   [provider]  gabapentin (NEURONTIN) 300 MG capsule TAKE 1-2 CAPSULES BY  MOUTH 3 TIMES A DAY AS NEEDED 02/16/17   Schnier, Latina CraverGregory G, MD  triamterene-hydrochlorothiazide (MAXZIDE) 75-50 MG tablet Take 1 tablet by mouth daily. 08/25/17   Ellyn HackShah, Syed Asad A, MD    Allergies Patient has no known allergies.  Family History  Problem Relation Age of Onset  . Stroke Father     Social History Social History   Tobacco Use  . Smoking status: Current  Every Day Smoker    Packs/day: 1.50    Years: 49.00    Pack years: 73.50  . Smokeless tobacco: Never Used  Substance Use Topics  . Alcohol use: No    Alcohol/week: 0.0 oz  . Drug use: No    Review of Systems  Constitutional: Positive for fever and chills. Eyes: No redness. ENT: No sore throat. Cardiovascular: Denies chest pain. Respiratory: Denies shortness of breath.  Positive for chronic cough. Gastrointestinal: No vomiting.  No diarrhea.  Genitourinary: Negative for dysuria.  Musculoskeletal: Negative for back pain. Skin: Negative for rash. Neurological: Positive for occipital headache.   ____________________________________________   PHYSICAL EXAM:  VITAL SIGNS: ED Triage Vitals  Enc Vitals Group     BP 11/11/17 1102 (!) 71/44     Pulse Rate 11/11/17 1102 88     Resp 11/11/17 1102 16     Temp 11/11/17 1102 98 F (36.7 C)     Temp src --      SpO2 11/11/17 1102 96 %     Weight 11/11/17 1107 154 lb (69.9 kg)     Height 11/11/17 1107 5\' 10"  (1.778 m)     Head Circumference --      Peak Flow --      Pain Score 11/11/17 1122 4     Pain Loc --      Pain Edu? --      Excl. in GC? --     Constitutional: Alert and oriented.  Somewhat weak appearing but in no acute distress.   Eyes: Conjunctivae are normal.  EOMI.  PERRLA. Head: Atraumatic. Nose: No congestion/rhinnorhea. Mouth/Throat: Mucous membranes are slightly dry.   Neck: Normal range of motion.  Supple.  No meningeal signs. Cardiovascular: Normal rate, regular rhythm. Grossly normal heart sounds.  Good peripheral circulation. Respiratory: Normal respiratory effort.  No retractions.  Somewhat decreased breath sounds bilaterally with scattered rhonchi. Gastrointestinal: Soft and nontender. No distention.  Genitourinary: No flank tenderness. Musculoskeletal: No lower extremity edema.  Extremities warm and well perfused.  Neurologic:  Normal speech and language.  Cranial nerves III through XII intact.  Normal  coordination with no ataxia.  Motor and sensory intact in all extremities.  No gross focal neurologic deficits are appreciated.  Skin:  Skin is warm and dry. No rash noted. Psychiatric: Mood and affect are normal. Speech and behavior are normal.  ____________________________________________   LABS (all labs ordered are listed, but only abnormal results are displayed)  Labs Reviewed  COMPREHENSIVE METABOLIC PANEL - Abnormal; Notable for the following components:      Result Value   Sodium 123 (*)    Potassium 2.9 (*)    Chloride 84 (*)    Glucose, Bld 141 (*)    Calcium 8.7 (*)    AST 50 (*)    All other components within normal limits  CBC WITH DIFFERENTIAL/PLATELET - Abnormal; Notable for the following components:   WBC 3.3 (*)    RBC 3.81 (*)    HCT 37.6 (*)    MCH 35.1 (*)  Platelets 148 (*)    Lymphs Abs 0.3 (*)    Monocytes Absolute 0.1 (*)    All other components within normal limits  TROPONIN I  URINALYSIS, COMPLETE (UACMP) WITH MICROSCOPIC   ____________________________________________  EKG  ED ECG REPORT I, Dionne Bucy, the attending physician, personally viewed and interpreted this ECG.  Date: 11/11/2017 EKG Time: 1109 Rate: 91 Rhythm: normal sinus rhythm QRS Axis: normal Intervals: normal ST/T Wave abnormalities: Nonspecific T wave abnormalities laterally Narrative Interpretation: no evidence of acute ischemia; slightly more prominent lateral T wave inversions when compared to EKG of 09/16/2016  ____________________________________________  RADIOLOGY  CXR: No focal infiltrate CT head: No ICH, hydrocephalus, or other acute findings  ____________________________________________   PROCEDURES  Procedure(s) performed: No  Procedures  Critical Care performed: No ____________________________________________   INITIAL IMPRESSION / ASSESSMENT AND PLAN / ED COURSE  Pertinent labs & imaging results that were available during my care of the  patient were reviewed by me and considered in my medical decision making (see chart for details).  67 year old male with past medical history as noted above presents with generalized weakness and feeling wobbly and unsteady over the last week, associated with decreased appetite, body aches, and fever chills.  Patient was seen at his family medicine practice today, had influenza swab which was negative, and was sent to the emergency department for further evaluation.  In the ED, initial vital signs revealed hypotension, but this resolved without intervention and appears to have been spurious.  Other vital signs are normal.  The patient is somewhat weak but otherwise relatively well-appearing, and the exam is as described above.  EKG is nonspecific.  Past medical records reviewed in Epic and are noncontributory.  Differential includes viral syndrome, pneumonia, bronchitis, UTI or other infection, dehydration or other metabolic cause, or less likely cardiac cause.  Also consider less likely CNS cause such as NPH.  We will obtain a CT of the head, chest x-ray, basic labs, UA, give fluids and reassess.    ----------------------------------------- 2:11 PM on 11/11/2017 -----------------------------------------  CT head and chest x-ray shows no concerning acute findings.  Patient's blood pressures remained stable in the ED.  Lab workup reveals hyponatremia and hypokalemia which are consistent with patient's symptoms.  Therefore we will admit for repletion.  I signed the patient out to the hospitalist. ____________________________________________   FINAL CLINICAL IMPRESSION(S) / ED DIAGNOSES  Final diagnoses:  Hyponatremia  Hypokalemia      NEW MEDICATIONS STARTED DURING THIS VISIT:  New Prescriptions   No medications on file     Note:  This document was prepared using Dragon voice recognition software and may include unintentional dictation errors.    Dionne Bucy, MD 11/11/17  [phone]

## 2017-11-11 NOTE — Patient Instructions (Signed)
Please go directly to [facility] ER for further evaluation.  

## 2017-11-11 NOTE — Progress Notes (Signed)
Name: Jared Harmon   MRN: [id number]    DOB: December 05, 1950   Date:11/11/2017       Progress Note  Subjective  Chief Complaint  Chief Complaint  Patient presents with  . Fever    nauseated, unsteady, no appettie since yesterday    HPI  Pt presents with 4 days of feeling unsteady on his feet, intermittent nausea, decreased appetite, body aches, fevers and chills (100.86F last night), chronic cough.  Denies vomiting or diarrhea, abdominal pain, dysuria, urinary frequency, nasal congestion, sore throat, chest pain, shortness of breath.  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  He describes unsteady gait for 4 days, notes this morning and lost his balance and fell back into the bathroom door but did not fall to the floor.  He took tylenol this morning and it helped his headache.   Patient Active Problem List   Diagnosis Date Noted  . Atherosclerosis of native arteries of extremity with intermittent claudication (HCC) 06/15/2017  . Carotid stenosis 06/15/2017  . Vitamin D deficiency 01/23/2017  . Annual physical exam 10/10/2016  . Hearing loss due to cerumen impaction 10/10/2016  . Inguinal hernia of right side without obstruction or gangrene 09/09/2016  . Cardiac murmur 08/21/2016  . Hyperglycemia 02/20/2016  . Observed seizure-like activity (HCC) 07/30/2015  . DDD (degenerative disc disease), cervical 07/30/2015  . Neurogenic claudication 07/30/2015  . Neuropathy 07/30/2015  . Hypertension 04/27/2015  . Dyslipidemia 04/27/2015  . B12 deficiency 04/27/2015  . Fatigue 04/27/2015  . Lumbar radiculopathy 04/27/2015  . 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 Smoking status: Current Every Day Smoker    Packs/day: 1.50    Years: 49.00    Pack years: 73.50  . Smokeless tobacco: Never Used  Substance Use Topics  . Alcohol use: No    Alcohol/week: 0.0 oz    Current Outpatient  Medications:  .  acetaminophen (TYLENOL) 500 MG tablet, Take 1,500 mg by mouth every 8 (eight) hours as needed for mild pain or headache. , Disp: , Rfl:  .  aspirin 81 MG tablet, Take 81 mg by mouth daily., Disp: , Rfl:  .  atorvastatin (LIPITOR) 40 MG tablet, Take 1 tablet (40 mg total) by mouth at bedtime., Disp: 90 tablet, Rfl: 1 .  carvedilol (COREG) 12.5 MG tablet, TAKE 1 TABLET BY MOUTH EVERY DAY, Disp: 90 tablet, Rfl: 1 .  [facility] VITAMIN B12 1000 MCG tablet, Take 1,000 mcg by mouth daily. Reported on 02/20/2016, Disp: , Rfl: 2 .  gabapentin (NEURONTIN) 300 MG capsule, TAKE 1-2 CAPSULES BY MOUTH 3 TIMES A DAY AS NEEDED, Disp: 540 capsule, Rfl: 2 .  triamterene-hydrochlorothiazide (MAXZIDE) 75-50 MG tablet, Take 1 tablet by mouth daily., Disp: 90 tablet, Rfl: 1  No Known Allergies  ROS  Constitutional: Negative for fever or weight change.  Respiratory: Negative for cough and shortness of breath.   Cardiovascular: Negative for chest pain or palpitations.  Gastrointestinal: Negative for abdominal pain, no bowel changes.  Musculoskeletal: Negative for gait problem or joint swelling.  Skin: Negative for rash.  Neurological: Negative for dizziness or headache.  No other specific complaints in a complete review of systems (except as listed in HPI above).  Objective  Vitals:   11/11/17 1005  BP: 134/86  Pulse: (!) 106  Resp: 18  Temp: 100.1 F (37.8 C)  TempSrc: Oral  SpO2: 94%  Weight: 154 lb 6.4  oz (70 kg)  Height: 5\' 10"  (1.778 m)   Body mass index is 22.15 kg/m.  Nursing Note and Vital Signs reviewed.  Physical Exam  Constitutional: Patient appears well-developed and well-nourished. Thin No distress.  HEENT: head atraumatic, normocephalic, pupils equal and reactive to light, EOM's intact, TM's without erythema or bulging, no maxillary or frontal sinus tenderness , neck supple without lymphadenopathy, oropharynx erythematous and moist without exudate Cardiovascular: Normal  rate, regular rhythm, S1/S2 present.  No murmur or rub heard. No BLE edema. Pulmonary/Chest: Effort normal and breath sounds diminished throughout with inspiratory wheeze in the RLL. No respiratory distress or retractions. Abdominal: Soft and non-tender, bowel sounds present x4 quadrants. Psychiatric: Patient has a normal mood and affect. behavior is normal. Judgment and thought content normal. Musculoskeletal: Normal range of motion, no joint effusions. No gross deformities Neurological: he is alert and oriented to person, place, and time. No cranial nerve deficit. Coordination, balance, strength, speech are at baseline; gait is unsteady - unable to do tandem ambulation.  Skin: Skin is warm and dry. No rash noted. No erythema.  Psychiatric: Patient has a normal mood and affect. behavior is normal. Judgment and thought content normal.  Results for orders placed or performed in visit on 11/11/17 (from the past 72 hour(s))  POCT Influenza A/B     Status: Normal   Collection Time: 11/11/17 10:37 AM  Result Value Ref Range   Influenza A, POC Negative Negative   Influenza B, POC Negative Negative    Assessment & Plan:   1. Fever, unspecified fever cause - POCT Influenza A/B 2. Generalized body aches - POCT Influenza A/B 3. Fatigue, unspecified type - POCT Influenza A/B 4. Loss of balance 5. Unsteady gait Advised that due to loss of balance/unsteady gait, fever and tachycardia along with his increased risk of complex lung infection due to heavy tobacco use, I recommend patient present for emergency care.  He is agreeable and declines EMS transport - wife will drive patient to the ER.  [facility]

## 2017-11-11 NOTE — ED Notes (Signed)
Pt given warm blanket.

## 2017-11-11 NOTE — ED Triage Notes (Signed)
Pt to ED c/o headache and feeling off balance. Pt states that this has been going on for the past few days. Pt is A & O at this time. BP 133/75

## 2017-11-11 NOTE — ED Notes (Signed)
Attempted to call report, RN unable to take report at this time. Will call me back

## 2017-11-11 NOTE — ED Notes (Signed)
BP taken in left arm twice and right arm once. BP in right arm 84/48

## 2017-11-11 NOTE — H&P (Signed)
[facility] [place] NAME: Jared Harmon    MR#:  [id number]  DATE OF BIRTH:  10-15-1950  DATE OF ADMISSION:  11/11/2017  PRIMARY CARE PHYSICIAN: Roselee Nova, MD (Inactive)   REQUESTING/REFERRING PHYSICIAN: Arta Silence, MD  CHIEF COMPLAINT:   Chief Complaint  Patient presents with  . Weakness    HISTORY OF PRESENT ILLNESS: Jared Harmon  is a 67 y.o. male with a known history of GERD, peripheral vascular disease, hyperlipidemia and hypertension who is presenting to the hospital with complaint of generalized weakness and difficulty with walking.  He was seen by his primary care provider and sent to the ED.  Patient in the ER with complaints of just difficulty with walking and feeling very tired.  He is noted to have hyponatremia hypokalemia.  He also has been having fevers chills however his flu test is negative.  Patient does have chronic cough which is unchanged.  PAST MEDICAL HISTORY:   Past Medical History:  Diagnosis Date  . Acid reflux   . Atherosclerotic occlusive disease 2014   LE  . GERD (gastroesophageal reflux disease)   . Heart murmur    NEWLY DX BY PCP-SEEN BY CARDIOLOGIST DR Yvone Neu JAN 2018-NOTE , ECHO IN [facility]  . Hyperlipidemia   . Hypertension     PAST SURGICAL HISTORY:  Past Surgical History:  Procedure Laterality Date  . COLONOSCOPY WITH PROPOFOL N/A 09/30/2016   Procedure: COLONOSCOPY WITH PROPOFOL;  Surgeon: Christene Lye, MD;  Location: [facility] ENDOSCOPY;  Service: Endoscopy;  Laterality: N/A;  . HERNIA REPAIR Left 2005  . INGUINAL HERNIA REPAIR Right 11/04/2016   Procedure: HERNIA REPAIR INGUINAL ADULT;  Surgeon: Christene Lye, MD;  Location: [facility] ORS;  Service: General;  Laterality: Right;  . INSERTION OF MESH  11/04/2016   Procedure: INSERTION OF MESH;  Surgeon: Christene Lye, MD;  Location: [facility] ORS;  Service: General;;  . VASCULAR SURGERY     ANGIOPLASTY AND STENTS IN  EXTERNAL ILIAC ARTERIES BILATERAL  (DR [facility])    SOCIAL HISTORY:  Social History   Tobacco Use  . Smoking status: Current Every Day Smoker    Packs/day: 1.50    Years: 49.00    Pack years: 73.50  . Smokeless tobacco: Never Used  Substance Use Topics  . Alcohol use: No    Alcohol/week: 0.0 oz    FAMILY HISTORY:  Family History  Problem Relation Age of Onset  . Stroke Father     DRUG ALLERGIES: No Known Allergies  REVIEW OF SYSTEMS:   CONSTITUTIONAL: positve fever, fatigue  Positive weakness.  EYES: No blurred or double vision.  EARS, NOSE, AND THROAT: No tinnitus or ear pain.  RESPIRATORY: Positive cough, shortness of breath, wheezing or hemoptysis.  CARDIOVASCULAR: No chest pain, orthopnea, edema.  GASTROINTESTINAL: No nausea, vomiting, diarrhea or abdominal pain.  GENITOURINARY: No dysuria, hematuria.  ENDOCRINE: No polyuria, nocturia,  HEMATOLOGY: No anemia, easy bruising or bleeding SKIN: No rash or lesion. MUSCULOSKELETAL: No joint pain or arthritis.   NEUROLOGIC: No tingling, numbness, weakness.  PSYCHIATRY: No anxiety or depression.   MEDICATIONS AT HOME:  Prior to Admission medications   Medication Sig Start Date End Date Taking? Authorizing Provider  acetaminophen (TYLENOL) 500 MG tablet Take 1,500 mg by mouth every 8 (eight) hours as needed for mild pain or headache.     [provider]  aspirin 81 MG tablet Take 81 mg by mouth daily.    [provider]  atorvastatin (LIPITOR) 40 MG tablet Take 1 tablet (40 mg total) by mouth at bedtime. 02/18/17   Roselee Nova, MD  carvedilol (COREG) 12.5 MG tablet TAKE 1 TABLET BY MOUTH EVERY DAY 09/03/17   Roselee Nova, MD  [facility] VITAMIN B12 1000 MCG tablet Take 1,000 mcg by mouth daily. Reported on 02/20/2016 12/12/14   [provider]  gabapentin (NEURONTIN) 300 MG capsule TAKE 1-2 CAPSULES BY MOUTH 3 TIMES A DAY AS NEEDED 02/16/17   Schnier, Dolores Lory, MD  triamterene-hydrochlorothiazide  (MAXZIDE) 75-50 MG tablet Take 1 tablet by mouth daily. 08/25/17   Roselee Nova, MD      PHYSICAL EXAMINATION:   VITAL SIGNS: Blood pressure (!) 149/67, pulse 78, temperature 98 F (36.7 C), resp. rate 12, height 5' 10"  (1.778 m), weight 154 lb (69.9 kg), SpO2 96 %.  GENERAL:  67 y.o.-year-old patient lying in the bed with no acute distress.  EYES: Pupils equal, round, reactive to light and accommodation. No scleral icterus. Extraocular muscles intact.  HEENT: Head atraumatic, normocephalic. Oropharynx and nasopharynx clear.  NECK:  Supple, no jugular venous distention. No thyroid enlargement, no tenderness.  LUNGS: Occasional wheezing  CARDIOVASCULAR: S1, S2 normal. No murmurs, rubs, or gallops.  ABDOMEN: Soft, nontender, nondistended. Bowel sounds present. No organomegaly or mass.  EXTREMITIES: No pedal edema, cyanosis, or clubbing.  NEUROLOGIC: Cranial nerves II through XII are intact. Muscle strength 5/5 in all extremities. Sensation intact. Gait not checked.  PSYCHIATRIC: The patient is alert and oriented x 3.  SKIN: No obvious rash, lesion, or ulcer.   LABORATORY PANEL:   CBC Recent Labs  Lab 11/11/17 1120  WBC 3.3*  HGB 13.4  HCT 37.6*  PLT 148*  MCV 98.7  MCH 35.1*  MCHC 35.6  RDW 11.9  LYMPHSABS 0.3*  MONOABS 0.1*  EOSABS 0.0  BASOSABS 0.0   ------------------------------------------------------------------------------------------------------------------  Chemistries  Recent Labs  Lab 11/11/17 1120  NA 123*  K 2.9*  CL 84*  CO2 27  GLUCOSE 141*  BUN 19  CREATININE 1.08  CALCIUM 8.7*  AST 50*  ALT 31  ALKPHOS 74  BILITOT 0.5   ------------------------------------------------------------------------------------------------------------------ estimated creatinine clearance is 65.6 mL/min (by C-G formula based on SCr of 1.08  mg/dL). ------------------------------------------------------------------------------------------------------------------ No results for input(s): TSH, T4TOTAL, T3FREE, THYROIDAB in the last 72 hours.  Invalid input(s): FREET3   Coagulation profile No results for input(s): INR, PROTIME in the last 168 hours. ------------------------------------------------------------------------------------------------------------------- No results for input(s): DDIMER in the last 72 hours. -------------------------------------------------------------------------------------------------------------------  Cardiac Enzymes Recent Labs  Lab 11/11/17 1120  TROPONINI <0.03   ------------------------------------------------------------------------------------------------------------------ Invalid input(s): POCBNP  ---------------------------------------------------------------------------------------------------------------  Urinalysis    Component Value Date/Time   COLORURINE YELLOW 06/25/2009 1113   APPEARANCEUR  06/25/2009 1113    CLEAR CORRECTED ON 10/11 AT 1340: PREVIOUSLY REPORTED AS CLOUDY   LABSPEC 1.010 06/25/2009 1113   PHURINE 7.0 06/25/2009 1113   GLUCOSEU NEGATIVE 06/25/2009 1113   HGBUR NEGATIVE 06/25/2009 1113   BILIRUBINUR NEGATIVE 06/25/2009 1113   KETONESUR NEGATIVE 06/25/2009 1113   PROTEINUR NEGATIVE 06/25/2009 1113   UROBILINOGEN 0.2 06/25/2009 1113   NITRITE NEGATIVE 06/25/2009 1113   LEUKOCYTESUR  06/25/2009 1113    NEGATIVE MICROSCOPIC NOT DONE ON URINES WITH NEGATIVE PROTEIN, BLOOD, LEUKOCYTES, NITRITE, OR GLUCOSE <1000 mg/dL.     RADIOLOGY: Dg Chest 2 View  Result Date: 11/11/2017 CLINICAL DATA:  Several day history of dizziness and headache. Several falls recently due to losing his balance.  EXAM: CHEST  2 VIEW COMPARISON:  None in PACs FINDINGS: The lungs are mildly hyperinflated. The interstitial markings are coarse. There is biapical pleural thickening. The  heart and pulmonary vascularity are normal. There is calcification in the wall of the aortic arch. There is no pleural effusion. There is mild multilevel degenerative disc disease of the thoracic spine. IMPRESSION: Hyperinflation with increased lung markings is most compatible with chronic bronchitis and/or smoking related changes. No definite acute pneumonia nor CHF. Thoracic aortic atherosclerosis. Electronically Signed   By: David  Martinique M.D.   On: 11/11/2017 12:18   Ct Head Wo Contrast  Result Date: 11/11/2017 CLINICAL DATA:  Pt presents with 4 days of feeling unsteady on his feet, intermittent nausea, decreased appetite, body aches, fevers and chills (100.51F last night), chronic cough. Denies vomiting or diarrhea, abdominal pain, dysuria. Altered level of consciousness. EXAM: CT HEAD WITHOUT CONTRAST TECHNIQUE: Contiguous axial images were obtained from the base of the skull through the vertex without intravenous contrast. COMPARISON:  06/25/2009 FINDINGS: Brain: No evidence of acute infarction, hemorrhage, extra-axial collection, ventriculomegaly, or mass effect. Generalized cerebral atrophy. Periventricular white matter low attenuation likely secondary to microangiopathy. Vascular: Cerebrovascular atherosclerotic calcifications are noted. Skull: Negative for fracture or focal lesion. Sinuses/Orbits: Visualized portions of the orbits are unremarkable. Visualized portions of the paranasal sinuses and mastoid air cells are unremarkable. Other: None. IMPRESSION: 1. No acute intracranial pathology. 2. Chronic microvascular disease and cerebral atrophy. Electronically Signed   By: Kathreen Devoid   On: 11/11/2017 11:58    EKG: Orders placed or performed during the hospital encounter of 11/11/17  . EKG 12-Lead  . EKG 12-Lead    IMPRESSION AND PLAN: Patient is a 67 year old white male presenting with complaint of generalized weakness  1.  Hyponatremia likely related to his diuretic therapy and  dehydration Give IV fluids  2.  Hypokalemia again due to diuretic therapy replace potassium  3.  Essential hypertension hold HCTZ [employer] train, continue Coreg  4.  Hyperlipidemia continue atorvastatin  5.  Nicotine abuse smoking cessation provided I spent 4 minutes recommended patient stop smoking Nicotine patch offered patient does not want a nicotine patch   All the records are reviewed and case discussed with ED provider. Management plans discussed with the patient, family and they are in agreement.  CODE STATUS: Code Status History    This patient does not have a recorded code status. Please follow your organizational policy for patients in this situation.       TOTAL TIME TAKING CARE OF THIS PATIENT:44mnutes.    SDustin FlockM.D on 11/11/2017 at 2:08 PM  Between 7am to 6pm - Pager - [phone]  After 6pm go to [email or web address] - password [email or web address] [facility]  Office  [phone] CC: Primary care physician; SRoselee Nova MD (Inactive)

## 2017-11-11 NOTE — ED Notes (Signed)
Pt reports that he has been feeling off balance and has had a headache. Pt states that this has been going on for the past several days. Pt has had a few falls recently due to losing his balance.  Pt seen by PCP this morning and tested negative for flu. PCP sent to ED to be evaluated for pneumonia.

## 2017-11-11 NOTE — ED Notes (Signed)
First Nurse Note:  Patient here from [facility].  Seen there this AM and referred here with fever, tachycardia, unsteady on feet, and nausea.

## 2017-11-12 ENCOUNTER — Encounter: Payer: Self-pay | Admitting: Internal Medicine

## 2017-11-12 ENCOUNTER — Telehealth: Payer: Self-pay | Admitting: Family Medicine

## 2017-11-12 LAB — BASIC METABOLIC PANEL
Anion gap: 6 (ref 5–15)
BUN: 15 mg/dL (ref 6–20)
CALCIUM: 7.8 mg/dL — AB (ref 8.9–10.3)
CO2: 25 mmol/L (ref 22–32)
Chloride: 94 mmol/L — ABNORMAL LOW (ref 101–111)
Creatinine, Ser: 0.79 mg/dL (ref 0.61–1.24)
GFR calc Af Amer: >60 mL/min (ref >60)
Glucose, Bld: 138 mg/dL — ABNORMAL HIGH (ref 65–99)
POTASSIUM: 3.4 mmol/L — AB (ref 3.5–5.1)
Sodium: 125 mmol/L — ABNORMAL LOW (ref 135–145)

## 2017-11-12 LAB — CBC
HCT: 31.8 % — ABNORMAL LOW (ref 40.0–52.0)
Hemoglobin: 11.4 g/dL — ABNORMAL LOW (ref 13.0–18.0)
MCH: 35.4 pg — AB (ref 26.0–34.0)
MCHC: 35.8 g/dL (ref 32.0–36.0)
MCV: 98.9 fL (ref 80.0–100.0)
PLATELETS: 117 10*3/uL — AB (ref 150–440)
RBC: 3.22 MIL/uL — ABNORMAL LOW (ref 4.40–5.90)
RDW: 12 % (ref 11.5–14.5)
WBC: 1.9 10*3/uL — ABNORMAL LOW (ref 3.8–10.6)

## 2017-11-12 LAB — LACTIC ACID, PLASMA: Lactic Acid, Venous: 0.7 mmol/L (ref 0.5–1.9)

## 2017-11-12 LAB — MRSA PCR SCREENING: MRSA by PCR: NEGATIVE

## 2017-11-12 MED ORDER — LEVOFLOXACIN 500 MG PO TABS: 500.0000 mg | ORAL_TABLET | Freq: Every day | ORAL | 0 refills | Status: AC

## 2017-11-12 MED ORDER — LEVOFLOXACIN IN D5W 750 MG/150ML IV SOLN
750.0000 mg | INTRAVENOUS | Status: DC
  Administered 2017-11-12: 750 mg via INTRAVENOUS
  Filled 2017-11-12: qty 150

## 2017-11-12 NOTE — Progress Notes (Addendum)
CODE SEPSIS - PHARMACY COMMUNICATION  **Broad Spectrum Antibiotics should be administered within 1 hour of Sepsis diagnosis**  Time Code Sepsis Called/Page Received: n/a  Antibiotics Ordered: levaquin then vanc/zosyn  Time of 1st antibiotic administration: 2202  Additional action taken by pharmacy: Patient met 3/4 SIRS criteria and came in as code sepsis; only levaquin was ordered for suspected COPD exacerbation; however, patient had PNA on CXR. Called MD to switch to broad-spectrum agents vanc/zosyn ordered MRSA PCR which is now negative, so will not continue vanc and will continue zosyn for now. MD notified and agrees with plan.  If necessary, Name of Provider/Nurse Contacted: Sid Falcon ,PharmD Clinical Pharmacist  11/12/2017  3:26 AM

## 2017-11-12 NOTE — Progress Notes (Signed)
MD order received to discharge pt home today; verbally reviewed AVS with pt and pt's spouse, Phillips HayJean Latulippe; no questions voiced at this time; pt discharged via wheelchair by volunteer to the visitor's entrance

## 2017-11-12 NOTE — Evaluation (Signed)
Physical Therapy Evaluation Patient Details Name: Jared HartshornDelbert M Harmon MRN: [id number] DOB: 1950-09-16 Today's Date: 11/12/2017   History of Present Illness  67 y.o. male with a known history of GERD, peripheral vascular disease, hyperlipidemia and hypertension who is presenting to the hospital with complaint of generalized weakness and difficulty with walking.  He was seen by his primary care provider and sent to the ED.  He was noted to have hyponatremia hypokalemia.    Clinical Impression  Pt not overly interested in doing a lot with PT and states he feels fine, but he ultimately agreed to try and circumambulate the nurses' station and negotiate up/down steps.  He states he feels slightly weaker than his normal but is confident that he'll be fine.  Pt did not show any safety issues that would not allow him to go home, he is not interested in further PT intervention and this PT is not making recommendations.    Follow Up Recommendations No PT follow up    Equipment Recommendations  None recommended by PT    Recommendations for Other Services       Precautions / Restrictions Precautions Precautions: None Restrictions Weight Bearing Restrictions: No      Mobility  Bed Mobility Overal bed mobility: Independent             General bed mobility comments: Pt able to get himself to sitting w/o assist  Transfers Overall transfer level: Modified independent Equipment used: None             General transfer comment: Pt able to rise to standing w/o assist, showed no LOBs/safety issues  Ambulation/Gait Ambulation/Gait assistance: Supervision Ambulation Distance (Feet): 300 Feet Assistive device: None       General Gait Details: Pt with slow but steady gait.  He reports that it's maybe a little slower than normal but that he feels fine.  No LOBs or safety issues  Stairs Stairs: Yes Stairs assistance: Modified independent (Device/Increase time) Stair Management: One rail  Right Number of Stairs: 4 General stair comments: reciprocal negotiation going up, set-to descending  Wheelchair Mobility    Modified Rankin (Stroke Patients Only)       Balance Overall balance assessment: Independent                                           Pertinent Vitals/Pain Pain Assessment: No/denies pain    Home Living Family/patient expects to be discharged to:: Private residence Living Arrangements: Spouse/significant other Available Help at Discharge: Family Type of Home: House Home Access: Stairs to enter Entrance Stairs-Rails: Right Entrance Stairs-Number of Steps: 3   Home Equipment: None      Prior Function Level of Independence: Independent         Comments: Pt works as custodian doing plenty of walking, lifting, bending, etc     Hand Dominance        Extremity/Trunk Assessment   Upper Extremity Assessment Upper Extremity Assessment: Overall WFL for tasks assessed    Lower Extremity Assessment Lower Extremity Assessment: Overall WFL for tasks assessed       Communication   Communication: No difficulties  Cognition Arousal/Alertness: Awake/alert Behavior During Therapy: Impulsive Overall Cognitive Status: Within Functional Limits for tasks assessed  General Comments: Pt reports being a little weak, but mostly feeling himself      General Comments      Exercises     Assessment/Plan    PT Assessment Patent does not need any further PT services  PT Problem List         PT Treatment Interventions      PT Goals (Current goals can be found in the Care Plan section)  Acute Rehab PT Goals Patient Stated Goal: go home PT Goal Formulation: All assessment and education complete, DC therapy    Frequency     Barriers to discharge        Co-evaluation               AM-PAC PT "6 Clicks" Daily Activity  Outcome Measure Difficulty turning over in bed  (including adjusting bedclothes, sheets and blankets)?: None Difficulty moving from lying on back to sitting on the side of the bed? : None Difficulty sitting down on and standing up from a chair with arms (e.g., wheelchair, bedside commode, etc,.)?: None Help needed moving to and from a bed to chair (including a wheelchair)?: None Help needed walking in hospital room?: None Help needed climbing 3-5 steps with a railing? : None 6 Click Score: 24    End of Session Equipment Utilized During Treatment: Gait belt Activity Tolerance: Patient tolerated treatment well Patient left: with chair alarm set;with call bell/phone within reach Nurse Communication: Mobility status PT Visit Diagnosis: Muscle weakness (generalized) (M62.81);Difficulty in walking, not elsewhere classified (R26.2)    Time: 1610-9604 PT Time Calculation (min) (ACUTE ONLY): 18 min   Charges:   PT Evaluation $PT Eval Low Complexity: 1 Low     PT G CodesMalachi Pro, DPT 11/12/2017, 11:06 AM

## 2017-11-12 NOTE — Telephone Encounter (Signed)
Sharlette DenseSharika jones, RN at [facility] called to make pt hospital follow up appointment; pt to be seen by Dr Carlynn PurlSowles but no  appointments available; spoke with Bjorn Loserhonda, flow coordinator at [facility] and was instructed to schedule pt with Maurice SmallEmily Boyce for 40 minutes since Dr Carlynn PurlSowles has no availability; pt scheduled with Maurice SmallEmily Boyce on Thursday November 19, 2017 at 0800; Sharika verbalizes understanding.

## 2017-11-13 ENCOUNTER — Telehealth: Payer: Self-pay

## 2017-11-13 NOTE — Discharge Summary (Signed)
[employer] - [place] [facility]   PATIENT NAME: Jared Harmon    MR#:  [id number]  DATE OF BIRTH:  1951/03/05  DATE OF ADMISSION:  11/11/2017 ADMITTING PHYSICIAN: Auburn BilberryShreyang Patel, MD  DATE OF DISCHARGE: 11/12/2017  2:32 PM  PRIMARY CARE PHYSICIAN: Ellyn HackShah, Syed Asad A, MD (Inactive)   ADMISSION DIAGNOSIS:  Hypokalemia [E87.6] Hyponatremia [E87.1]  DISCHARGE DIAGNOSIS:  Active Problems:   Hyponatremia   SECONDARY DIAGNOSIS:   Past Medical History:  Diagnosis Date  . Acid reflux   . Atherosclerotic occlusive disease 2014   LE  . GERD (gastroesophageal reflux disease)   . Heart murmur    NEWLY DX BY PCP-SEEN BY CARDIOLOGIST DR Alvino ChapelINGAL JAN 2018-NOTE , ECHO IN EPIC  . Hyperlipidemia   . Hypertension      ADMITTING HISTORY  HISTORY OF PRESENT ILLNESS: Jared Harmon  is a 67 y.o. male with a known history of GERD, peripheral vascular disease, hyperlipidemia and hypertension who is presenting to the hospital with complaint of generalized weakness and difficulty with walking.  He was seen by his primary care provider and sent to the ED.  Patient in the ER with complaints of just difficulty with walking and feeling very tired.  He is noted to have hyponatremia hypokalemia.  He also has been having fevers chills however his flu test is negative.  Patient does have chronic cough which is unchanged.  HOSPITAL COURSE:   *Community-acquired pneumonia with sepsis and COPD exacerbation Patient was treated with IV antibiotics, IV fluids, nebulizers, steroids.  He improved well.  By the day of discharge he is ambulating in the hallway without any problem.  Saturations on room air 96%.  Afebrile.  He feels his breathing is back to normal and has requested to be discharged home. Influenza negative.  Other comorbidities remained stable.  Counseled to quit smoking.  Discharged home in stable condition.  CONSULTS OBTAINED:    DRUG ALLERGIES:  No Known  Allergies  DISCHARGE MEDICATIONS:   Allergies as of 11/12/2017   No Known Allergies     Medication List    STOP taking these medications   triamterene-hydrochlorothiazide 75-50 MG tablet Commonly known as:  MAXZIDE     TAKE these medications   aspirin 81 MG tablet Take 81 mg by mouth daily.   atorvastatin 40 MG tablet Commonly known as:  LIPITOR Take 1 tablet (40 mg total) by mouth at bedtime.   carvedilol 12.5 MG tablet Commonly known as:  COREG TAKE 1 TABLET BY MOUTH EVERY DAY   [employer] VITAMIN B12 1000 MCG tablet Generic drug:  cyanocobalamin Take 1,000 mcg by mouth daily.   gabapentin 300 MG capsule Commonly known as:  NEURONTIN TAKE 1-2 CAPSULES BY MOUTH 3 TIMES A DAY AS NEEDED   levofloxacin 500 MG tablet Commonly known as:  LEVAQUIN Take 1 tablet (500 mg total) by mouth daily.   meloxicam 7.5 MG tablet Commonly known as:  MOBIC Take 7.5 mg by mouth daily.   TYLENOL 500 MG tablet Generic drug:  acetaminophen Take 1,500 mg by mouth every 8 (eight) hours as needed for mild pain or headache.       Today   VITAL SIGNS:  Blood pressure (!) 137/53, pulse 85, temperature 98.2 F (36.8 C), temperature source Oral, resp. rate 20, height 5\' 10"  (1.778 m), weight 69.9 kg (154 lb), SpO2 95 %.  I/O:  No intake or output data in the 24 hours ending 11/13/17 1527  PHYSICAL EXAMINATION:  Physical Exam  GENERAL:  67 y.o.-year-old patient lying in the bed with no acute distress.  LUNGS: Normal breath sounds bilaterally, no wheezing, rales,rhonchi or crepitation. No use of accessory muscles of respiration.  CARDIOVASCULAR: S1, S2 normal. No murmurs, rubs, or gallops.  ABDOMEN: Soft, non-tender, non-distended. Bowel sounds present. No organomegaly or mass.  NEUROLOGIC: Moves all 4 extremities. PSYCHIATRIC: The patient is alert and oriented x 3.  SKIN: No obvious rash, lesion, or ulcer.   DATA REVIEW:   CBC Recent Labs  Lab 11/12/17 0029  WBC 1.9*  HGB 11.4*   HCT 31.8*  PLT 117*    Chemistries  Recent Labs  Lab 11/11/17 1120 11/12/17 0029  NA 123* 125*  K 2.9* 3.4*  CL 84* 94*  CO2 27 25  GLUCOSE 141* 138*  BUN 19 15  CREATININE 1.08 0.79  CALCIUM 8.7* 7.8*  AST 50*  --   ALT 31  --   ALKPHOS 74  --   BILITOT 0.5  --     Cardiac Enzymes Recent Labs  Lab 11/11/17 1120  TROPONINI <0.03    Microbiology Results  Results for orders placed or performed during the hospital encounter of 11/11/17  CULTURE, BLOOD (ROUTINE X 2) w Reflex to ID Panel     Status: None (Preliminary result)   Collection Time: 11/11/17  9:22 PM  Result Value Ref Range Status   Specimen Description BLOOD LEFT ANTECUBITAL  Final   Special Requests   Final    BOTTLES DRAWN AEROBIC AND ANAEROBIC Blood Culture adequate volume   Culture   Final    NO GROWTH 2 DAYS Performed at [facility], [address] [place], [place] [address]    Report Status PENDING  Incomplete  CULTURE, BLOOD (ROUTINE X 2) w Reflex to ID Panel     Status: None (Preliminary result)   Collection Time: 11/11/17  9:22 PM  Result Value Ref Range Status   Specimen Description BLOOD BLOOD LEFT WRIST  Final   Special Requests   Final    BOTTLES DRAWN AEROBIC AND ANAEROBIC Blood Culture results may not be optimal due to an inadequate volume of blood received in culture bottles   Culture   Final    NO GROWTH 2 DAYS Performed at [facility], [address] [place], [place] [address]    Report Status PENDING  Incomplete  MRSA PCR Screening     Status: None   Collection Time: 11/11/17 10:55 PM  Result Value Ref Range Status   MRSA by PCR NEGATIVE NEGATIVE Final    Comment:        The GeneXpert MRSA Assay (FDA approved for NASAL specimens only), is one component of a comprehensive MRSA colonization surveillance program. It is not intended to diagnose MRSA infection nor to guide or monitor treatment for MRSA infections. Performed at [facility],  [address] [place] [place], [place] [address]     RADIOLOGY:  Dg Chest Port 1 View  Result Date: 11/11/2017 CLINICAL DATA:  Sudden onset fever. EXAM: PORTABLE CHEST 1 VIEW COMPARISON:  11/11/2017. FINDINGS: Normal sized heart. The lungs remain hyperexpanded. Interval mild, ill-defined opacity in the right mid lung zone. Diffuse osteopenia. Thoracic spine degenerative changes. IMPRESSION: 1. Possible mild pneumonia in the right mid lung zone. 2. Stable changes of COPD. Electronically Signed   By: Beckie Salts M.D.   On: 11/11/2017 21:53    Follow up with PCP in 1 week.  Management plans discussed with the patient, family and  they are in agreement.  CODE STATUS:  Code Status History    Date Active Date Inactive Code Status Order ID Comments User Context   11/11/2017 14:20 11/12/2017 18:09 Full Code [id number]  Auburn Bilberry, MD ED      TOTAL TIME TAKING CARE OF THIS PATIENT ON DAY OF DISCHARGE: more than 30 minutes.   Molinda Bailiff Alecsander Hattabaugh M.D on 11/13/2017 at 3:27 PM  Between 7am to 6pm - Pager - [phone]  After 6pm go to [email or web address] - password [email or web address] [facility]  [facility] [place] [facility]  Office  [phone]  CC: Primary care physician; Ellyn Hack, MD (Inactive)  Note: This dictation was prepared with Dragon dictation along with smaller phrase technology. Any transcriptional errors that result from this process are unintentional.

## 2017-11-13 NOTE — Telephone Encounter (Signed)
TOC #1. Called pt to f/u after [facility]. Also wanted to confirm their hosp f/u appt w/ Maurice SmallEmily Boyce, FNP on 11/19/17 @ 8:00am. Discharge planning includes the following:  - start Levaquin - d/c Maxzide - continue with smoking cessation - f/u PCP in one week  As part of the TOC f/u call, I am also wanting to discuss/review the above information with the pt to ensure all of the above has been taken care of. Unable to reach pt d/t phone being busy. Also tried to reach pt via cell phone. LVM requesting returned call.

## 2017-11-15 ENCOUNTER — Other Ambulatory Visit (INDEPENDENT_AMBULATORY_CARE_PROVIDER_SITE_OTHER): Payer: Self-pay | Admitting: Vascular Surgery

## 2017-11-16 LAB — CULTURE, BLOOD (ROUTINE X 2)
Culture: NO GROWTH
Culture: NO GROWTH
Special Requests: ADEQUATE

## 2017-11-17 ENCOUNTER — Other Ambulatory Visit: Payer: Self-pay

## 2017-11-17 ENCOUNTER — Emergency Department: Payer: BC Managed Care – PPO

## 2017-11-17 ENCOUNTER — Inpatient Hospital Stay
Admission: EM | Admit: 2017-11-17 | Discharge: 2017-12-14 | DRG: 871 | Disposition: E | Payer: BC Managed Care – PPO | Attending: Internal Medicine | Admitting: Internal Medicine

## 2017-11-17 DIAGNOSIS — Z8701 Personal history of pneumonia (recurrent): Secondary | ICD-10-CM | POA: Diagnosis not present

## 2017-11-17 DIAGNOSIS — R6521 Severe sepsis with septic shock: Secondary | ICD-10-CM | POA: Diagnosis not present

## 2017-11-17 DIAGNOSIS — J9602 Acute respiratory failure with hypercapnia: Secondary | ICD-10-CM | POA: Diagnosis not present

## 2017-11-17 DIAGNOSIS — E785 Hyperlipidemia, unspecified: Secondary | ICD-10-CM | POA: Diagnosis present

## 2017-11-17 DIAGNOSIS — J449 Chronic obstructive pulmonary disease, unspecified: Secondary | ICD-10-CM | POA: Diagnosis present

## 2017-11-17 DIAGNOSIS — Z7982 Long term (current) use of aspirin: Secondary | ICD-10-CM | POA: Diagnosis not present

## 2017-11-17 DIAGNOSIS — A419 Sepsis, unspecified organism: Secondary | ICD-10-CM | POA: Diagnosis present

## 2017-11-17 DIAGNOSIS — R011 Cardiac murmur, unspecified: Secondary | ICD-10-CM | POA: Diagnosis present

## 2017-11-17 DIAGNOSIS — E870 Hyperosmolality and hypernatremia: Secondary | ICD-10-CM | POA: Diagnosis present

## 2017-11-17 DIAGNOSIS — I468 Cardiac arrest due to other underlying condition: Secondary | ICD-10-CM | POA: Diagnosis not present

## 2017-11-17 DIAGNOSIS — Z01818 Encounter for other preprocedural examination: Secondary | ICD-10-CM

## 2017-11-17 DIAGNOSIS — K219 Gastro-esophageal reflux disease without esophagitis: Secondary | ICD-10-CM | POA: Diagnosis present

## 2017-11-17 DIAGNOSIS — D6959 Other secondary thrombocytopenia: Secondary | ICD-10-CM | POA: Diagnosis present

## 2017-11-17 DIAGNOSIS — Z791 Long term (current) use of non-steroidal anti-inflammatories (NSAID): Secondary | ICD-10-CM | POA: Diagnosis not present

## 2017-11-17 DIAGNOSIS — I10 Essential (primary) hypertension: Secondary | ICD-10-CM | POA: Diagnosis present

## 2017-11-17 DIAGNOSIS — J918 Pleural effusion in other conditions classified elsewhere: Secondary | ICD-10-CM | POA: Diagnosis not present

## 2017-11-17 DIAGNOSIS — J181 Lobar pneumonia, unspecified organism: Secondary | ICD-10-CM | POA: Diagnosis not present

## 2017-11-17 DIAGNOSIS — J9809 Other diseases of bronchus, not elsewhere classified: Secondary | ICD-10-CM | POA: Diagnosis not present

## 2017-11-17 DIAGNOSIS — J9601 Acute respiratory failure with hypoxia: Secondary | ICD-10-CM | POA: Diagnosis not present

## 2017-11-17 DIAGNOSIS — R002 Palpitations: Secondary | ICD-10-CM | POA: Diagnosis present

## 2017-11-17 DIAGNOSIS — Z79899 Other long term (current) drug therapy: Secondary | ICD-10-CM | POA: Diagnosis not present

## 2017-11-17 DIAGNOSIS — D696 Thrombocytopenia, unspecified: Secondary | ICD-10-CM | POA: Diagnosis not present

## 2017-11-17 DIAGNOSIS — F17210 Nicotine dependence, cigarettes, uncomplicated: Secondary | ICD-10-CM | POA: Diagnosis present

## 2017-11-17 DIAGNOSIS — E872 Acidosis, unspecified: Secondary | ICD-10-CM

## 2017-11-17 DIAGNOSIS — Y95 Nosocomial condition: Secondary | ICD-10-CM | POA: Diagnosis present

## 2017-11-17 DIAGNOSIS — J189 Pneumonia, unspecified organism: Secondary | ICD-10-CM

## 2017-11-17 DIAGNOSIS — Z4659 Encounter for fitting and adjustment of other gastrointestinal appliance and device: Secondary | ICD-10-CM

## 2017-11-17 DIAGNOSIS — J9600 Acute respiratory failure, unspecified whether with hypoxia or hypercapnia: Secondary | ICD-10-CM

## 2017-11-17 DIAGNOSIS — I471 Supraventricular tachycardia: Secondary | ICD-10-CM | POA: Diagnosis not present

## 2017-11-17 DIAGNOSIS — Z720 Tobacco use: Secondary | ICD-10-CM | POA: Diagnosis not present

## 2017-11-17 DIAGNOSIS — G9341 Metabolic encephalopathy: Secondary | ICD-10-CM | POA: Diagnosis not present

## 2017-11-17 LAB — CBC
HCT: 41.2 % (ref 40.0–52.0)
Hemoglobin: 14.1 g/dL (ref 13.0–18.0)
MCH: 34 pg (ref 26.0–34.0)
MCHC: 34.3 g/dL (ref 32.0–36.0)
MCV: 99.3 fL (ref 80.0–100.0)
Platelets: 41 10*3/uL — ABNORMAL LOW (ref 150–440)
RBC: 4.15 MIL/uL — ABNORMAL LOW (ref 4.40–5.90)
RDW: 12.7 % (ref 11.5–14.5)
WBC: 3 10*3/uL — ABNORMAL LOW (ref 3.8–10.6)

## 2017-11-17 LAB — URINALYSIS, COMPLETE (UACMP) WITH MICROSCOPIC
Bacteria, UA: NONE SEEN
Bilirubin Urine: NEGATIVE
Glucose, UA: NEGATIVE mg/dL
Ketones, ur: NEGATIVE mg/dL
Leukocytes, UA: NEGATIVE
Nitrite: NEGATIVE
Protein, ur: 30 mg/dL — AB
Specific Gravity, Urine: 1.018 (ref 1.005–1.030)
Squamous Epithelial / LPF: NONE SEEN
pH: 6 (ref 5.0–8.0)

## 2017-11-17 LAB — TSH: TSH: 0.474 u[IU]/mL (ref 0.350–4.500)

## 2017-11-17 LAB — COMPREHENSIVE METABOLIC PANEL
ALBUMIN: 3 g/dL — AB (ref 3.5–5.0)
ALT: 92 U/L — ABNORMAL HIGH (ref 17–63)
ANION GAP: 13 (ref 5–15)
AST: 265 U/L — ABNORMAL HIGH (ref 15–41)
Alkaline Phosphatase: 62 U/L (ref 38–126)
BILIRUBIN TOTAL: 1.1 mg/dL (ref 0.3–1.2)
BUN: 34 mg/dL — ABNORMAL HIGH (ref 6–20)
CO2: 25 mmol/L (ref 22–32)
Calcium: 7.6 mg/dL — ABNORMAL LOW (ref 8.9–10.3)
Chloride: 96 mmol/L — ABNORMAL LOW (ref 101–111)
Creatinine, Ser: 1.18 mg/dL (ref 0.61–1.24)
GFR calc Af Amer: >60 mL/min (ref >60)
GFR calc non Af Amer: >60 mL/min (ref >60)
GLUCOSE: 140 mg/dL — AB (ref 65–99)
POTASSIUM: 3.6 mmol/L (ref 3.5–5.1)
SODIUM: 134 mmol/L — AB (ref 135–145)
TOTAL PROTEIN: 5.9 g/dL — AB (ref 6.5–8.1)

## 2017-11-17 LAB — BLOOD GAS, VENOUS
ACID-BASE EXCESS: 4.7 mmol/L — AB (ref 0.0–2.0)
Bicarbonate: 30.4 mmol/L — ABNORMAL HIGH (ref 20.0–28.0)
PH VEN: 7.41 (ref 7.250–7.430)
Patient temperature: 37
pCO2, Ven: 48 mmHg (ref 44.0–60.0)

## 2017-11-17 LAB — LACTIC ACID, PLASMA
LACTIC ACID, VENOUS: 3.2 mmol/L — AB (ref 0.5–1.9)
LACTIC ACID, VENOUS: 3.2 mmol/L — AB (ref 0.5–1.9)

## 2017-11-17 LAB — MRSA PCR SCREENING: MRSA BY PCR: NEGATIVE

## 2017-11-17 LAB — ETHANOL: Alcohol, Ethyl (B): <10 mg/dL (ref <10)

## 2017-11-17 LAB — TROPONIN I: Troponin I: 0.05 ng/mL (ref <0.03)

## 2017-11-17 MED ORDER — VANCOMYCIN HCL 10 G IV SOLR
1250.0000 mg | INTRAVENOUS | Status: DC
  Administered 2017-11-17: 1250 mg via INTRAVENOUS
  Filled 2017-11-17 (×2): qty 1250

## 2017-11-17 MED ORDER — ONDANSETRON HCL 4 MG PO TABS: 4.0000 mg | ORAL_TABLET | Freq: Four times a day (QID) | ORAL | Status: DC | PRN

## 2017-11-17 MED ORDER — ONDANSETRON HCL 4 MG/2ML IJ SOLN: 4.0000 mg | Freq: Four times a day (QID) | INTRAMUSCULAR | Status: DC | PRN

## 2017-11-17 MED ORDER — LEVOFLOXACIN IN D5W 750 MG/150ML IV SOLN
750.0000 mg | Freq: Once | INTRAVENOUS | Status: AC
  Administered 2017-11-17: 750 mg via INTRAVENOUS
  Filled 2017-11-17: qty 150

## 2017-11-17 MED ORDER — SODIUM CHLORIDE 0.9 % IV SOLN
INTRAVENOUS | Status: DC
  Administered 2017-11-17 – 2017-11-19 (×8): via INTRAVENOUS

## 2017-11-17 MED ORDER — SODIUM CHLORIDE 0.9 % IV BOLUS (SEPSIS)
1000.0000 mL | Freq: Once | INTRAVENOUS | Status: AC
  Administered 2017-11-17: 1000 mL via INTRAVENOUS

## 2017-11-17 MED ORDER — MELOXICAM 7.5 MG PO TABS
7.5000 mg | ORAL_TABLET | Freq: Every day | ORAL | Status: DC | PRN
  Filled 2017-11-17: qty 1

## 2017-11-17 MED ORDER — ATORVASTATIN CALCIUM 20 MG PO TABS: 40.0000 mg | ORAL_TABLET | Freq: Every day | ORAL | Status: DC

## 2017-11-17 MED ORDER — GABAPENTIN 300 MG PO CAPS: 600.0000 mg | ORAL_CAPSULE | Freq: Three times a day (TID) | ORAL | Status: DC | PRN

## 2017-11-17 MED ORDER — VANCOMYCIN HCL IN DEXTROSE 1-5 GM/200ML-% IV SOLN
1000.0000 mg | Freq: Once | INTRAVENOUS | Status: AC
  Administered 2017-11-17: 1000 mg via INTRAVENOUS
  Filled 2017-11-17: qty 200

## 2017-11-17 MED ORDER — CARVEDILOL 12.5 MG PO TABS
12.5000 mg | ORAL_TABLET | Freq: Every day | ORAL | Status: DC
  Filled 2017-11-17: qty 4

## 2017-11-17 MED ORDER — ACETAMINOPHEN 650 MG RE SUPP: 650.0000 mg | Freq: Four times a day (QID) | RECTAL | Status: DC | PRN

## 2017-11-17 MED ORDER — SODIUM CHLORIDE 0.9 % IV SOLN
2.0000 g | Freq: Two times a day (BID) | INTRAVENOUS | Status: DC
  Administered 2017-11-17 – 2017-11-18 (×3): 2 g via INTRAVENOUS
  Filled 2017-11-17 (×4): qty 2

## 2017-11-17 MED ORDER — ASPIRIN 81 MG PO CHEW
81.0000 mg | CHEWABLE_TABLET | Freq: Every day | ORAL | Status: DC
  Filled 2017-11-17: qty 1

## 2017-11-17 MED ORDER — DOCUSATE SODIUM 100 MG PO CAPS
100.0000 mg | ORAL_CAPSULE | Freq: Two times a day (BID) | ORAL | Status: DC
  Filled 2017-11-17: qty 1

## 2017-11-17 MED ORDER — ACETAMINOPHEN 325 MG PO TABS: 650.0000 mg | ORAL_TABLET | Freq: Four times a day (QID) | ORAL | Status: DC | PRN

## 2017-11-17 MED ORDER — VITAMIN B-12 1000 MCG PO TABS
1000.0000 ug | ORAL_TABLET | Freq: Every day | ORAL | Status: DC
  Filled 2017-11-17: qty 1

## 2017-11-17 NOTE — ED Notes (Signed)
Pt wanting to sit on side of bed, pt would like to have feet on floor. For pt comfort, pt placed in recliner chair with assistance. Pt sitting in chair in NAD at this time, family is at bedside. Notified again to let this RN know if pt needs to get up, family verbalizes understanding. Pt continues to have yellow socks and arm band on.

## 2017-11-17 NOTE — H&P (Addendum)
Jared Harmon is an 67 y.o. male.   Chief Complaint: Shortness of breath HPI: The patient with past medical history of hypertension, hyperlipidemia and heart murmur presents to the emergency department with shortness of breath.  The patient was seen in the emergency department last week for the same and treated for pneumonia.  He met criteria for sepsis at that time and again meets criteria for sepsis with this encounter.  Emergency department staff initiated sepsis protocol and placed the patient on supplemental oxygen.  During transport the patient also apparently had some SVT and received adenosine 6 mg en route.  Once patient was stabilized the emergency department staff called the hospitalist service for admission.  Past Medical History:  Diagnosis Date  . Acid reflux   . Atherosclerotic occlusive disease 2014   LE  . GERD (gastroesophageal reflux disease)   . Heart murmur    NEWLY DX BY PCP-SEEN BY CARDIOLOGIST DR Yvone Neu JAN 2018-NOTE , ECHO IN EPIC  . Hyperlipidemia   . Hypertension     Past Surgical History:  Procedure Laterality Date  . COLONOSCOPY WITH PROPOFOL N/A 09/30/2016   Procedure: COLONOSCOPY WITH PROPOFOL;  Surgeon: Christene Lye, MD;  Location: [facility] ENDOSCOPY;  Service: Endoscopy;  Laterality: N/A;  . HERNIA REPAIR Left 2005  . INGUINAL HERNIA REPAIR Right 11/04/2016   Procedure: HERNIA REPAIR INGUINAL ADULT;  Surgeon: Christene Lye, MD;  Location: [facility] ORS;  Service: General;  Laterality: Right;  . INSERTION OF MESH  11/04/2016   Procedure: INSERTION OF MESH;  Surgeon: Christene Lye, MD;  Location: [facility] ORS;  Service: General;;  . 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 Stroke Father    Social History:  reports that he has been smoking.  He has a 73.50 pack-year smoking history. he has never used smokeless tobacco. He reports that he  does not drink alcohol or use drugs.  Allergies: No Known Allergies  Medications Prior to Admission  Medication Sig Dispense Refill  . acetaminophen (TYLENOL) 500 MG tablet Take 1,500 mg by mouth every 8 (eight) hours as needed for mild pain or headache.     Marland Kitchen aspirin 81 MG tablet Take 81 mg by mouth daily.    Marland Kitchen atorvastatin (LIPITOR) 40 MG tablet Take 1 tablet (40 mg total) by mouth at bedtime. 90 tablet 1  . carvedilol (COREG) 12.5 MG tablet TAKE 1 TABLET BY MOUTH EVERY DAY 90 tablet 1  . gabapentin (NEURONTIN) 300 MG capsule TAKE 1-2 CAPSULES BY MOUTH 3 TIMES A DAY AS NEEDED (Patient taking differently: TAKE 2 CAPSULES BY MOUTH 3 TIMES A DAY AS NEEDED) 540 capsule 2  . levofloxacin (LEVAQUIN) 500 MG tablet Take 1 tablet (500 mg total) by mouth daily. 6 tablet 0  . meloxicam (MOBIC) 7.5 MG tablet Take 7.5 mg by mouth daily.    . CVS VITAMIN B12 1000 MCG tablet Take 1,000 mcg by mouth daily.   2    Results for orders placed or performed during the hospital encounter of 11/25/2017 (from the past 48 hour(s))  CBC     Status: Abnormal   Collection Time: 11/22/2017 12:22 AM  Result Value Ref Range   WBC 3.0 (L) 3.8 - 10.6 K/uL   RBC 4.15 (L) 4.40 - 5.90 MIL/uL   Hemoglobin 14.1 13.0 - 18.0 g/dL   HCT 41.2 40.0 - 52.0 %  MCV 99.3 80.0 - 100.0 fL   MCH 34.0 26.0 - 34.0 pg   MCHC 34.3 32.0 - 36.0 g/dL   RDW 12.7 11.5 - 14.5 %   Platelets 41 (L) 150 - 440 K/uL    Comment: Performed at [facility], [address] [place], [place] [address]  Comprehensive metabolic panel     Status: Abnormal   Collection Time: 11/16/2017 12:22 AM  Result Value Ref Range   Sodium 134 (L) 135 - 145 mmol/L   Potassium 3.6 3.5 - 5.1 mmol/L   Chloride 96 (L) 101 - 111 mmol/L   CO2 25 22 - 32 mmol/L   Glucose, Bld 140 (H) 65 - 99 mg/dL   BUN 34 (H) 6 - 20 mg/dL   Creatinine, Ser 1.18 0.61 - 1.24 mg/dL   Calcium 7.6 (L) 8.9 - 10.3 mg/dL   Total Protein 5.9 (L) 6.5 - 8.1 g/dL   Albumin 3.0 (L) 3.5 -  5.0 g/dL   AST 265 (H) 15 - 41 U/L   ALT 92 (H) 17 - 63 U/L   Alkaline Phosphatase 62 38 - 126 U/L   Total Bilirubin 1.1 0.3 - 1.2 mg/dL   GFR calc non Af Amer >60 >60 mL/min   GFR calc Af Amer >60 >60 mL/min    Comment: (NOTE) The eGFR has been calculated using the CKD EPI equation. This calculation has not been validated in all clinical situations. eGFR's persistently <60 mL/min signify possible Chronic Kidney Disease.    Anion gap 13 5 - 15    Comment: Performed at [facility], [address] [place], [place] [address]  Troponin I     Status: Abnormal   Collection Time: 11/13/2017 12:22 AM  Result Value Ref Range   Troponin I 0.05 (HH) <0.03 ng/mL    Comment: CRITICAL RESULT CALLED TO, READ BACK BY AND VERIFIED WITH CASEY ROBERTS ON 12/07/2017 AT 0[address] [facility] Performed at [facility], [address] [place], [place] [address]   Lactic acid, plasma     Status: Abnormal   Collection Time: 12/08/2017 12:22 AM  Result Value Ref Range   Lactic Acid, Venous 3.2 (HH) 0.5 - 1.9 mmol/L    Comment: CRITICAL RESULT CALLED TO, READ BACK BY AND VERIFIED WITH CASEY ROBERTS ON 11/16/2017 AT 0104 [facility] Performed at [facility], [address] [place], [place] [address]   Blood gas, venous     Status: Abnormal (Preliminary result)   Collection Time: 11/28/2017 12:22 AM  Result Value Ref Range   pH, Ven 7.41 7.250 - 7.430   pCO2, Ven 48 44.0 - 60.0 mmHg   pO2, Ven PENDING 32.0 - 45.0 mmHg   Bicarbonate 30.4 (H) 20.0 - 28.0 mmol/L   Acid-Base Excess 4.7 (H) 0.0 - 2.0 mmol/L   O2 Saturation PENDING %   Patient temperature 37.0    Collection site VENOUS    Sample type VENOUS     Comment: Performed at [facility], [address] [place], [place] [address]  Lactic acid, plasma     Status: Abnormal   Collection Time: 11/30/2017  2:00 AM  Result Value Ref Range   Lactic Acid, Venous 3.2 (HH) 0.5 - 1.9 mmol/L    Comment: CRITICAL RESULT CALLED TO, READ BACK BY AND  VERIFIED WITH CASEY ROBERTS ON 11/16/2017 AT [facility] Performed at [facility], [address], [place],  [address]    Dg Chest Portable 1 View  Result Date:  12/09/2017 CLINICAL DATA:  67 y/o  M: Shortness of breath, cough and confusion EXAM: PORTABLE CHEST 1 VIEW COMPARISON:  Chest radiograph 11/11/17 FINDINGS: Bibasilar predominant reticular opacities, right worse than left. No sizable pleural effusion or pneumothorax.Normal cardiomediastinal contours. IMPRESSION: Bibasilar linear opacities, possibly atelectasis or developing infection. Upright PA and lateral radiographs might be helpful for further evaluation. Electronically Signed   By: Ulyses Jarred M.D.   On: 11/25/2017 00:53    Review of Systems  Unable to perform ROS: Other  Constitutional: Positive for chills and fever.  Respiratory: Positive for cough and shortness of breath.   Neurological: Positive for weakness.  The patient declines to engage with interview  Blood pressure (!) 104/53, pulse (!) 104, temperature 97.9 F (36.6 C), temperature source Oral, resp. rate 19, height 5' 10"  (1.778 m), weight 67.8 kg (149 lb 7.6 oz), SpO2 95 %. Physical Exam  Vitals reviewed. Constitutional: He is oriented to person, place, and time. He appears well-developed and well-nourished. No distress.  HENT:  Head: Normocephalic and atraumatic.  Mouth/Throat: Oropharynx is clear and moist.  Eyes: Conjunctivae and EOM are normal. Pupils are equal, round, and reactive to light. No scleral icterus.  Neck: Normal range of motion. Neck supple. No JVD present. No tracheal deviation present. No thyromegaly present.  Cardiovascular: Normal rate, regular rhythm and normal heart sounds. Exam reveals no gallop and no friction rub.  No murmur heard. Respiratory: Effort normal and breath sounds normal. No respiratory distress. He has no wheezes.  GI: Soft. Bowel sounds are normal. He exhibits no distension. There is no tenderness.   Genitourinary:  Genitourinary Comments: Deferred  Musculoskeletal: Normal range of motion. He exhibits no edema.  Lymphadenopathy:    He has no cervical adenopathy.  Neurological: He is alert and oriented to person, place, and time. No cranial nerve deficit.  Skin: Skin is warm and dry. No rash noted. No erythema.  Psychiatric: His behavior is normal.  Difficult to assess mood, thought and judgment as the patient is reticent or frustrated or too weak to participate in interview     Assessment/Plan This is a 67 year old male admitted for sepsis. 1.  Sepsis: Likely source pneumonia.  The patient meets criteria via tachycardia, tachypnea and leukopenia.  Elevated lactic acid supporting data for sepsis.  The patient has been started on broad-spectrum antibiotics.  He is hemodynamically stable.  Follow blood cultures for growth and sensitivities. 2.  Pneumonia: healthcare-associated.  My interpretation of his x-ray is not consistent with pneumonia however the patient does have slightly increased opacities in his lower lobes than on previous films.  His lung fields are consistent with severe COPD although the patient does not use oxygen at home.  Supplemental oxygen as needed while hospitalized. 3.  Transaminitis: Possibly consistent with alcohol abuse.  Check ethanol level.  Differential diagnosis includes low flow state of sepsis.  Hydrate with intravenous fluid. 4.  Elevated troponin: Etiology is most likely sepsis.  Continue to follow cardiac biomarkers.  Monitor telemetry.  The patient denies any chest pain.  Manage blood pressure. 5.  Hypertension: Controlled; continue carvedilol 6.  Hyperlipidemia: Continue statin therapy 7.  DVT prophylaxis: SCDs secondary to thrombocytopenia (also potential side effect of alcohol abuse) 8.  GI prophylaxis: None The patient is a full code.  Time spent on admission orders and patient care approximately 45 minutes  Harrie Foreman, MD 12/10/2017, 6:01  AM

## 2017-11-17 NOTE — ED Notes (Signed)
Pt placed back in bed, pt resting at this time.

## 2017-11-17 NOTE — Progress Notes (Signed)
Pharmacy Antibiotic Note  Jared HartshornDelbert M Harmon is a 67 y.o. male admitted on 12/11/2017 with pneumonia.  Pharmacy has been consulted for cefepime + vancomycin dosing.  Plan: Cefepime 2 g IV q12h  Vancomycin 1000 mg IV received overnight in ED. Will start vancomycin 1250 mg IV q24h Goal VT 15-20 mcg/mL  Kinetics: Weight - 68 kg, CrCl 58 mL/min Ke: 0.053 Half-life: 13 hrs Vd: 48 L Cmin ~15 mcg/mL  Height: 5\' 10"  (177.8 cm) Weight: 149 lb 7.6 oz (67.8 kg) IBW/kg (Calculated) : 73  Temp (24hrs), Avg:98.1 F (36.7 C), Min:97.9 F (36.6 C), Max:98.2 F (36.8 C)  Recent Labs  Lab 11/11/17 1120 11/11/17 2231 11/12/17 0029 September 13, 2018 0022 September 13, 2018 0200  WBC 3.3*  --  1.9* 3.0*  --   CREATININE 1.08  --  0.79 1.18  --   LATICACIDVEN  --  0.7 0.7 3.2* 3.2*    Estimated Creatinine Clearance: 58.3 mL/min (by C-G formula based on SCr of 1.18 mg/dL).    No Known Allergies  Antimicrobials this admission: vancomycin 3/5 >>  cefepime 3/5 >>   Dose adjustments this admission:  Microbiology results: 3/5 BCx: No growth < 12 hrs 3/5 MRSA PCR: Sent  Thank you for allowing pharmacy to be a part of this patient's care.  Jared Harmon, PharmD, BCPS Clinical Pharmacist 11/26/2017 10:46 AM

## 2017-11-17 NOTE — Progress Notes (Signed)
Patient uncooperative.  Pt will not answer questions and refuses to engage this RN.  Wife in room and when asked questions she states "I don't know".  Pt given IV ABX and fluids changed.  Pt's wife refused patient's oral medications for him and said he can't swallow anything because of sore tongue.  Left sticky note for MD yesterday on admission.  Asked pt's wife if she mentioned it to the physician and she said she forgot. Unable to do any therapeutic teaching with pt/wife at this time. Henriette CombsSarah Aveon Colquhoun RN

## 2017-11-17 NOTE — ED Provider Notes (Signed)
[facility] Emergency Department Provider Note   ____________________________________________   First MD Initiated Contact with Patient 11/19/2017 0010     (approximate)  I have reviewed the triage vital signs and the nursing notes.   HISTORY  Chief Complaint Shortness of Breath and Tachycardia  Patient confused and unable to fully participate in history  HPI Jared Harmon is a 67 y.o. male who according to EMS comes into the hospital today with some cough and shortness of breath.  The patient was recently here and admitted for pneumonia.  They state that he is continued to have cough and shortness of breath.  EMS was called out tonight and reports that when they arrived to him the patient was in SVT.  The patient was given some adenosine and his heart rate did improve.  He also received some normal saline but he seemed very confused.  He was started on some O2 although he was not hypoxic.  The family is concerned that the patient's pneumonia may be getting worse.  He is here today for evaluation.  The patient had a hard time telling me his full name and his birthday.   Past Medical History:  Diagnosis Date  . Acid reflux   . Atherosclerotic occlusive disease 2014   LE  . GERD (gastroesophageal reflux disease)   . Heart murmur    NEWLY DX BY PCP-SEEN BY CARDIOLOGIST DR Alvino Chapel JAN 2018-NOTE , ECHO IN EPIC  . Hyperlipidemia   . Hypertension     Patient Active Problem List   Diagnosis Date Noted  . Sepsis (HCC) 11/18/2017  . Hyponatremia 11/11/2017  . Atherosclerosis of native arteries of extremity with intermittent claudication (HCC) 06/15/2017  . Carotid stenosis 06/15/2017  . Vitamin D deficiency 01/23/2017  . Annual physical exam 10/10/2016  . Hearing loss due to cerumen impaction 10/10/2016  . Inguinal hernia of right side without obstruction or gangrene 09/09/2016  . Cardiac murmur 08/21/2016  . Hyperglycemia 02/20/2016  . Observed  seizure-like activity (HCC) 07/30/2015  . DDD (degenerative disc disease), cervical 07/30/2015  . Neurogenic claudication 07/30/2015  . Neuropathy 07/30/2015  . Hypertension 04/27/2015  . Dyslipidemia 04/27/2015  . B12 deficiency 04/27/2015  . Fatigue 04/27/2015  . Lumbar radiculopathy 04/27/2015  . Peripheral neuropathic pain 04/27/2015    Past Surgical History:  Procedure Laterality Date  . COLONOSCOPY WITH PROPOFOL N/A 09/30/2016   Procedure: COLONOSCOPY WITH PROPOFOL;  Surgeon: Kieth Brightly, MD;  Location: [facility] ENDOSCOPY;  Service: Endoscopy;  Laterality: N/A;  . HERNIA REPAIR Left 2005  . INGUINAL HERNIA REPAIR Right 11/04/2016   Procedure: HERNIA REPAIR INGUINAL ADULT;  Surgeon: Kieth Brightly, MD;  Location: [facility] ORS;  Service: General;  Laterality: Right;  . INSERTION OF MESH  11/04/2016   Procedure: INSERTION OF MESH;  Surgeon: Kieth Brightly, MD;  Location: [facility] ORS;  Service: General;;  . VASCULAR SURGERY     ANGIOPLASTY AND STENTS IN EXTERNAL ILIAC ARTERIES BILATERAL  (DR [facility])    Prior to Admission medications   Medication Sig Start Date End Date Taking? Authorizing Provider  acetaminophen (TYLENOL) 500 MG tablet Take 1,500 mg by mouth every 8 (eight) hours as needed for mild pain or headache.    Yes [provider]  aspirin 81 MG tablet Take 81 mg by mouth daily.   Yes [provider]  atorvastatin (LIPITOR) 40 MG tablet Take 1 tablet (40 mg total) by mouth at bedtime. 02/18/17  Yes Brayton El Asad A,  MD  carvedilol (COREG) 12.5 MG tablet TAKE 1 TABLET BY MOUTH EVERY DAY 09/03/17  Yes Brayton El Asad A, MD  gabapentin (NEURONTIN) 300 MG capsule TAKE 1-2 CAPSULES BY MOUTH 3 TIMES A DAY AS NEEDED Patient taking differently: TAKE 2 CAPSULES BY MOUTH 3 TIMES A DAY AS NEEDED 11/16/17  Yes Schnier, Latina Craver, MD  levofloxacin (LEVAQUIN) 500 MG tablet Take 1 tablet (500 mg total) by mouth daily. 11/12/17  Yes Sudini, Wardell Heath, MD  meloxicam  (MOBIC) 7.5 MG tablet Take 7.5 mg by mouth daily. 11/11/17  Yes [provider]  [facility] VITAMIN B12 1000 MCG tablet Take 1,000 mcg by mouth daily.     [provider]    Allergies Patient has no known allergies.  Family History  Problem Relation Age of Onset  . Stroke Father     Social History Social History   Tobacco Use  . Smoking status: Current Every Day Smoker    Packs/day: 1.50    Years: 49.00    Pack years: 73.50  . Smokeless tobacco: Never Used  Substance Use Topics  . Alcohol use: No    Alcohol/week: 0.0 oz  . Drug use: No    Review of Systems  Constitutional: No fever/chills Eyes: No visual changes. ENT: No sore throat. Cardiovascular: Palpitations Respiratory: Cough and shortness of breath. Gastrointestinal: No abdominal pain.  No nausea, no vomiting.   Genitourinary: Negative for dysuria. Musculoskeletal: Negative for back pain. Skin: Negative for rash. Neurological: Negative for headaches, focal weakness or numbness. Psych: Confusion  ____________________________________________   PHYSICAL EXAM:  VITAL SIGNS: ED Triage Vitals [12/10/2017 0018]  Enc Vitals Group     BP 100/63     Pulse Rate (!) 112     Resp (!) 25     Temp 98.2 F (36.8 C)     Temp Source Oral     SpO2 94 %     Weight 150 lb (68 kg)     Height 5\' 9"  (1.753 m)     Head Circumference      Peak Flow      Pain Score      Pain Loc      Pain Edu?      Excl. in GC?     Constitutional: Alert and oriented to place but patient could not tell me his full name or his birthday.  The patient also cannot tell me the date.. Well appearing and in moderate distress. Eyes: Conjunctivae are normal. PERRL. EOMI. Head: Atraumatic. Nose: No congestion/rhinnorhea. Mouth/Throat: Mucous membranes are moist.  Oropharynx non-erythematous. Cardiovascular: Tachycardia, regular rhythm. Grossly normal heart sounds.  Good peripheral circulation. Respiratory: Normal respiratory effort.   No retractions.  Decreased breath sounds throughout all lung fields Gastrointestinal: Soft and nontender. No distention.  Positive bowel sounds Musculoskeletal: No lower extremity tenderness nor edema.   Neurologic:  Normal speech and language.  Skin:  Skin is warm, dry and intact. Psychiatric: Mood and affect are normal.   ____________________________________________   LABS (all labs ordered are listed, but only abnormal results are displayed)  Labs Reviewed  CBC - Abnormal; Notable for the following components:      Result Value   WBC 3.0 (*)    RBC 4.15 (*)    Platelets 41 (*)    All other components within normal limits  COMPREHENSIVE METABOLIC PANEL - Abnormal; Notable for the following components:   Sodium 134 (*)    Chloride 96 (*)    Glucose, Bld 140 (*)  BUN 34 (*)    Calcium 7.6 (*)    Total Protein 5.9 (*)    Albumin 3.0 (*)    AST 265 (*)    ALT 92 (*)    All other components within normal limits  TROPONIN I - Abnormal; Notable for the following components:   Troponin I 0.05 (*)    All other components within normal limits  LACTIC ACID, PLASMA - Abnormal; Notable for the following components:   Lactic Acid, Venous 3.2 (*)    All other components within normal limits  LACTIC ACID, PLASMA - Abnormal; Notable for the following components:   Lactic Acid, Venous 3.2 (*)    All other components within normal limits  BLOOD GAS, VENOUS - Abnormal; Notable for the following components:   Bicarbonate 30.4 (*)    Acid-Base Excess 4.7 (*)    All other components within normal limits  CULTURE, BLOOD (ROUTINE X 2)  CULTURE, BLOOD (ROUTINE X 2)  URINALYSIS, COMPLETE (UACMP) WITH MICROSCOPIC   ____________________________________________  EKG  ED ECG REPORT I, Rebecka Apley, the attending physician, personally viewed and interpreted this ECG.   Date: 12/07/2017  EKG Time: 0051  Rate: 116  Rhythm: sinus tachycardia  Axis: normal  Intervals:none  ST&T  Change: T wave flattening in lead V4, V5, V6  ____________________________________________  RADIOLOGY  ED MD interpretation:  CXR: Bibasilar linear opacities  Official radiology report(s): Dg Chest Portable 1 View  Result Date: 12/09/2017 CLINICAL DATA:  67 y/o  M: Shortness of breath, cough and confusion EXAM: PORTABLE CHEST 1 VIEW COMPARISON:  Chest radiograph 11/11/17 FINDINGS: Bibasilar predominant reticular opacities, right worse than left. No sizable pleural effusion or pneumothorax.Normal cardiomediastinal contours. IMPRESSION: Bibasilar linear opacities, possibly atelectasis or developing infection. Upright PA and lateral radiographs might be helpful for further evaluation. Electronically Signed   By: Deatra Robinson M.D.   On: 11/16/2017 00:53    ____________________________________________   PROCEDURES  Procedure(s) performed: None  Procedures  Critical Care performed: No  ____________________________________________   INITIAL IMPRESSION / ASSESSMENT AND PLAN / ED COURSE  As part of my medical decision making, I reviewed the following data within the electronic MEDICAL RECORD NUMBER Notes from prior ED visits and [place] Controlled Substance Database   This is a 67 year old male who comes into the hospital today with some cough and shortness of breath.  The patient also had some SVT per EMS but he is simply tachycardic here.  My differential diagnosis includes COPD exacerbation as the patient does smoke, worsening pneumonia  I will check a CBC, CMP, troponin, lactic acid, blood gas and a chest x-ray on the patient.  He has some diminished breath sounds.  I will give him a DuoNeb treatment to see if that does help open him up.  He will be reassessed once I receive the results.  The patient was discharged from the hospital on February 28.  He had been treated for hyponatremia and hypokalemia as well as given levofloxacin for pneumonia.  Patient's white blood cell count is 3 but his  lactic acid is 3.2.  Given the linear opacities I will give the patient a dose of levofloxacin as well as vancomycin.  I am concerned the patient has some pneumonia.  He has been tachycardic and he will also received some fluid.  He did receive a liter of normal saline by EMS.  The patient will be admitted to the hospitalist service.      ____________________________________________   FINAL  CLINICAL IMPRESSION(S) / ED DIAGNOSES  Final diagnoses:  Palpitations  Healthcare-associated pneumonia  Lactic acidosis     ED Discharge Orders    None       Note:  This document was prepared using Dragon voice recognition software and may include unintentional dictation errors.    Rebecka ApleyWebster, Whitney Bingaman P, MD 07/11/2018 [phone]

## 2017-11-17 NOTE — ED Notes (Signed)
Pt attempted to urinate with assistance at this time, pt unable to give sample after trying.

## 2017-11-17 NOTE — ED Notes (Signed)
Pt's NS finished at this time.

## 2017-11-17 NOTE — ED Triage Notes (Signed)
Per [facility], pt from home with recent discharge with pneumonia. EMS reports pt has had increased SHOB since discharge, states current smoker, denies O2 use at home. EMS reports on their arrival pt was in SVT, given 6mg  of adenosine with rhythm change to ST 120's. Pt alertx2 on arrival, 94% on RA at this time. EDP in rm.

## 2017-11-17 NOTE — ED Notes (Signed)
Pt given NS by EMS and started on second bag by them which pt is getting at this time.

## 2017-11-17 NOTE — ED Notes (Signed)
Yellow socks and bracelet placed on pt. Family at bedside and notified to use call bell to let RN know if pt needs to get up. Family also notified urine sample is needed, family verbalizes understanding.

## 2017-11-17 NOTE — Progress Notes (Signed)
[employer] [facility] [place] [facility]   PATIENT NAME: Jared Harmon    MR#:  [id number]  DATE OF BIRTH:  February 16, 1951  SUBJECTIVE:  CHIEF COMPLAINT:   Chief Complaint  Patient presents with  . Shortness of Breath  . Tachycardia   Recent admission with pneumonia and treated with Levaquin and sent home after feeling better 4 days ago. Worsening in shortness of breath and generalized weakness for last 2 days and noted to have worsening of his pneumonia with sepsis.  REVIEW OF SYSTEMS:  CONSTITUTIONAL: No fever, positive for fatigue or weakness.  EYES: No blurred or double vision.  EARS, NOSE, AND THROAT: No tinnitus or ear pain.  RESPIRATORY: No cough, he have shortness of breath, wheezing , no hemoptysis.  CARDIOVASCULAR: No chest pain, orthopnea, edema.  GASTROINTESTINAL: No nausea, vomiting, diarrhea or abdominal pain.  GENITOURINARY: No dysuria, hematuria.  ENDOCRINE: No polyuria, nocturia,  HEMATOLOGY: No anemia, easy bruising or bleeding SKIN: No rash or lesion. MUSCULOSKELETAL: No joint pain or arthritis.   NEUROLOGIC: No tingling, numbness, weakness.  PSYCHIATRY: No anxiety or depression.   ROS  DRUG ALLERGIES:  No Known Allergies  VITALS:  Blood pressure (!) 140/92, pulse (!) 102, temperature 98.1 F (36.7 C), temperature source Axillary, resp. rate 18, height 5\' 10"  (1.778 m), weight 67.8 kg (149 lb 7.6 oz), SpO2 92 %.  PHYSICAL EXAMINATION:  GENERAL:  67 y.o.-year-old patient lying in the bed with no acute distress.  EYES: Pupils equal, round, reactive to light and accommodation. No scleral icterus. Extraocular muscles intact.  HEENT: Head atraumatic, normocephalic. Oropharynx and nasopharynx clear.  NECK:  Supple, no jugular venous distention. No thyroid enlargement, no tenderness.  LUNGS: Normal breath sounds bilaterally, no wheezing, bilateral crepitation. No use of accessory muscles of respiration.  CARDIOVASCULAR: S1, S2 normal. No murmurs,  rubs, or gallops.  ABDOMEN: Soft, nontender, nondistended. Bowel sounds present. No organomegaly or mass.  EXTREMITIES: No pedal edema, cyanosis, or clubbing.  NEUROLOGIC: Cranial nerves II through XII are intact. Muscle strength 4/5 in all extremities. Sensation intact. Gait not checked. Some shaking. PSYCHIATRIC: The patient is alert and oriented x 3.  SKIN: No obvious rash, lesion, or ulcer.   Physical Exam LABORATORY PANEL:   CBC Recent Labs  Lab 2017/12/10 0022  WBC 3.0*  HGB 14.1  HCT 41.2  PLT 41*   ------------------------------------------------------------------------------------------------------------------  Chemistries  Recent Labs  Lab December 10, 2017 0022  NA 134*  K 3.6  CL 96*  CO2 25  GLUCOSE 140*  BUN 34*  CREATININE 1.18  CALCIUM 7.6*  AST 265*  ALT 92*  ALKPHOS 62  BILITOT 1.1   ------------------------------------------------------------------------------------------------------------------  Cardiac Enzymes Recent Labs  Lab 11/11/17 1120 12-10-2017 0022  TROPONINI <0.03 0.05*   ------------------------------------------------------------------------------------------------------------------  RADIOLOGY:  Dg Chest Portable 1 View  Result Date: 2017-12-10 CLINICAL DATA:  67 y/o  M: Shortness of breath, cough and confusion EXAM: PORTABLE CHEST 1 VIEW COMPARISON:  Chest radiograph 11/11/17 FINDINGS: Bibasilar predominant reticular opacities, right worse than left. No sizable pleural effusion or pneumothorax.Normal cardiomediastinal contours. IMPRESSION: Bibasilar linear opacities, possibly atelectasis or developing infection. Upright PA and lateral radiographs might be helpful for further evaluation. Electronically Signed   By: Deatra Robinson M.D.   On: 12/10/17 00:53    ASSESSMENT AND PLAN:   Active Problems:   Sepsis (HCC)  1.  Sepsis: Likely source pneumonia.     Elevated lactic acid   started on broad-spectrum antibiotics.  He is hemodynamically  stable.  Follow blood cultures for growth and sensitivities. 2.  Pneumonia: healthcare-associated.    Broad-spectrum antibiotics, continue supplemental oxygen   MRSA PCR is negative so stop vancomycin.   Repeat chest x-ray tomorrow. 3.  Transaminitis: Possibly consistent with alcohol abuse.    Also could be low flow state of sepsis.  Hydrate with intravenous fluid. 4.  Elevated troponin: Etiology is most likely sepsis.     Monitor telemetry.  The patient denies any chest pain.  Manage blood pressure. 5.  Hypertension: Controlled; continue carvedilol 6.  Hyperlipidemia: Continue statin therapy 7.  DVT prophylaxis: SCDs secondary to thrombocytopenia (also potential side effect of alcohol abuse) 8.  GI prophylaxis: None 9. Thrombocytopenia   Likely secondary to sepsis, monitor tomorrow.  All the records are reviewed and case discussed with Care Management/Social Workerr. Management plans discussed with the patient, family and they are in agreement.  CODE STATUS: Full.  TOTAL TIME TAKING CARE OF THIS PATIENT: 35 minutes.    POSSIBLE D/C IN 1-2 DAYS, DEPENDING ON CLINICAL CONDITION.   Altamese DillingVaibhavkumar Kimoni Pickerill M.D on 12/03/2017   Between 7am to 6pm - Pager - [phone]  After 6pm go to [email or web address] - Social research officer, governmentpassword EPAS [facility]  [facility] [place] [facility]  Office  [phone]  CC: Primary care physician; Ellyn HackShah, Syed Asad A, MD  Note: This dictation was prepared with Dragon dictation along with smaller phrase technology. Any transcriptional errors that result from this process are unintentional.

## 2017-11-18 ENCOUNTER — Inpatient Hospital Stay: Payer: BC Managed Care – PPO

## 2017-11-18 LAB — COMPREHENSIVE METABOLIC PANEL
ALBUMIN: 2.6 g/dL — AB (ref 3.5–5.0)
ALT: 94 U/L — ABNORMAL HIGH (ref 17–63)
AST: 284 U/L — AB (ref 15–41)
Alkaline Phosphatase: 56 U/L (ref 38–126)
Anion gap: 7 (ref 5–15)
BUN: 33 mg/dL — AB (ref 6–20)
CHLORIDE: 110 mmol/L (ref 101–111)
CO2: 23 mmol/L (ref 22–32)
Calcium: 7.1 mg/dL — ABNORMAL LOW (ref 8.9–10.3)
Creatinine, Ser: 0.97 mg/dL (ref 0.61–1.24)
GFR calc Af Amer: >60 mL/min (ref >60)
GFR calc non Af Amer: >60 mL/min (ref >60)
GLUCOSE: 108 mg/dL — AB (ref 65–99)
POTASSIUM: 3.4 mmol/L — AB (ref 3.5–5.1)
SODIUM: 140 mmol/L (ref 135–145)
Total Bilirubin: 0.9 mg/dL (ref 0.3–1.2)
Total Protein: 5.1 g/dL — ABNORMAL LOW (ref 6.5–8.1)

## 2017-11-18 LAB — CBC
HCT: 34.3 % — ABNORMAL LOW (ref 40.0–52.0)
Hemoglobin: 11.8 g/dL — ABNORMAL LOW (ref 13.0–18.0)
MCH: 34.2 pg — ABNORMAL HIGH (ref 26.0–34.0)
MCHC: 34.4 g/dL (ref 32.0–36.0)
MCV: 99.4 fL (ref 80.0–100.0)
PLATELETS: 38 10*3/uL — AB (ref 150–440)
RBC: 3.45 MIL/uL — ABNORMAL LOW (ref 4.40–5.90)
RDW: 12.9 % (ref 11.5–14.5)
WBC: 3.2 10*3/uL — AB (ref 3.8–10.6)

## 2017-11-18 LAB — LACTIC ACID, PLASMA: LACTIC ACID, VENOUS: 1.9 mmol/L (ref 0.5–1.9)

## 2017-11-18 MED ORDER — IOPAMIDOL (ISOVUE-300) INJECTION 61%
75.0000 mL | Freq: Once | INTRAVENOUS | Status: AC | PRN
  Administered 2017-11-18: 13:00:00 75 mL via INTRAVENOUS

## 2017-11-18 MED ORDER — SODIUM CHLORIDE 0.9 % IV SOLN
2.0000 g | Freq: Three times a day (TID) | INTRAVENOUS | Status: DC
  Administered 2017-11-18 – 2017-11-19 (×5): 2 g via INTRAVENOUS
  Filled 2017-11-18 (×7): qty 2

## 2017-11-18 NOTE — Progress Notes (Addendum)
[facility] - [place] [facility]   PATIENT NAME: Jared Harmon    MR#:  [id number]  DATE OF BIRTH:  May 23, 1951  SUBJECTIVE:  CHIEF COMPLAINT:   Chief Complaint  Patient presents with  . Shortness of Breath  . Tachycardia   Recent admission with pneumonia and treated with Levaquin and sent home after feeling better 4 days ago. Worsening in shortness of breath and generalized weakness for last 2 days and noted to have worsening of his pneumonia with sepsis.  Some drowsy today. But easily arousable.  REVIEW OF SYSTEMS:  CONSTITUTIONAL: No fever, positive for fatigue or weakness.  EYES: No blurred or double vision.  EARS, NOSE, AND THROAT: No tinnitus or ear pain.  RESPIRATORY: No cough, he have shortness of breath, wheezing , no hemoptysis.  CARDIOVASCULAR: No chest pain, orthopnea, edema.  GASTROINTESTINAL: No nausea, vomiting, diarrhea or abdominal pain.  GENITOURINARY: No dysuria, hematuria.  ENDOCRINE: No polyuria, nocturia,  HEMATOLOGY: No anemia, easy bruising or bleeding SKIN: No rash or lesion. MUSCULOSKELETAL: No joint pain or arthritis.   NEUROLOGIC: No tingling, numbness, have generalized weakness.  PSYCHIATRY: No anxiety or depression.   ROS  DRUG ALLERGIES:  No Known Allergies  VITALS:  Blood pressure 129/64, pulse (!) 110, temperature 97.8 F (36.6 C), temperature source Oral, resp. rate 18, height 5\' 10"  (1.778 m), weight 69.4 kg (152 lb 16 oz), SpO2 95 %.  PHYSICAL EXAMINATION:  GENERAL:  67 y.o.-year-old patient lying in the bed with no acute distress.  EYES: Pupils equal, round, reactive to light and accommodation. No scleral icterus. Extraocular muscles intact.  HEENT: Head atraumatic, normocephalic. Oropharynx and nasopharynx clear.  NECK:  Supple, no jugular venous distention. No thyroid enlargement, no tenderness.  LUNGS: Normal breath sounds bilaterally, no wheezing, bilateral crepitation. No use of accessory muscles of  respiration. On supplemental oxygen. CARDIOVASCULAR: S1, S2 normal. No murmurs, rubs, or gallops.  ABDOMEN: Soft, nontender, nondistended. Bowel sounds present. No organomegaly or mass.  EXTREMITIES: No pedal edema, cyanosis, or clubbing.  NEUROLOGIC: Cranial nerves II through XII are intact. Muscle strength 3-4/5 in all extremities. Sensation intact. Gait not checked. Some shaking. PSYCHIATRIC: The patient is drowsy but easily arousable and oriented 2  SKIN: No obvious rash, lesion, or ulcer.   Physical Exam LABORATORY PANEL:   CBC Recent Labs  Lab 11/18/17 0429  WBC 3.2*  HGB 11.8*  HCT 34.3*  PLT 38*   ------------------------------------------------------------------------------------------------------------------  Chemistries  Recent Labs  Lab 11/18/17 0429  NA 140  K 3.4*  CL 110  CO2 23  GLUCOSE 108*  BUN 33*  CREATININE 0.97  CALCIUM 7.1*  AST 284*  ALT 94*  ALKPHOS 56  BILITOT 0.9   ------------------------------------------------------------------------------------------------------------------  Cardiac Enzymes Recent Labs  Lab 11/30/2017 0022  TROPONINI 0.05*   ------------------------------------------------------------------------------------------------------------------  RADIOLOGY:  Dg Chest 2 View  Result Date: 11/18/2017 CLINICAL DATA:  67 year old male with shortness of breath, undergoing sepsis evaluation. EXAM: CHEST - 2 VIEW COMPARISON:  11/26/2017 and earlier. FINDINGS: Seated AP and lateral views of the chest. There are moderate bilateral layering pleural effusions. There is a small volume of pleural fluid tracking along the major fissures. Chronic underlying pulmonary hyperinflation. Acute patchy and nodular opacity in the right mid and lower lung. Superimposed diffuse increased bilateral pulmonary interstitial markings. No pneumothorax. Overall ventilation has mildly decreased in both lungs compared to 11/28/2017. Mediastinal contours remain  normal. Visualized tracheal air column is within normal limits. Calcified aortic atherosclerosis. Osteopenia. Stable visualized osseous  structures. IMPRESSION: 1. Chronic lung disease with hyperinflation.  Superimposed: - moderate-sized layering bilateral pleural effusions. - diffuse increased pulmonary interstitial opacity with differential considerations of viral/atypical respiratory infection and interstitial edema. - patchy right mid and lower lung opacity suspicious for focal pneumonia. 2. Overall ventilation has mildly worsened compared to yesterday. Electronically Signed   By: Odessa Fleming M.D.   On: 11/18/2017 08:46   Ct Chest W Contrast  Result Date: 11/18/2017 CLINICAL DATA:  Diagnosis of pneumonia. Smoking history. Acute respiratory illness. EXAM: CT CHEST WITH CONTRAST TECHNIQUE: Multidetector CT imaging of the chest was performed during intravenous contrast administration. CONTRAST:  75mL ISOVUE-300 IOPAMIDOL (ISOVUE-300) INJECTION 61% COMPARISON:  Radiography same day and previous as distant as 11/11/2017 FINDINGS: Cardiovascular: Heart size is normal. There is aortic atherosclerosis but no aneurysm or dissection. There is extensive coronary artery calcification. No pericardial fluid. Pulmonary arterial opacification is mild-to-moderate, but no pulmonary emboli are seen. Mediastinum/Nodes: No superior mediastinal adenopathy. The patient has a thyroid goiter with intrathoracic extension on the right. Hilar nodes are prominent, more so on the right than the left. This is presumed to be reactive enlargement, but a right hilar mass is not excluded. Lungs/Pleura: Bilateral effusions layering dependently, larger on the right than the left. Dependent pulmonary atelectasis. Widespread emphysema. Right middle lobe collapse. Dependent atelectasis related to the effusions. As noted above, there is prominence of the right hilar tissues that could simply be a reactive lymph node, though the possibility of a right  hilar mass is not excluded, particularly in the setting of right middle lobe collapse. Upper Abdomen: Right adrenal gland is normal. Left adrenal gland is enlarged and nodular. This is nonspecific as evaluated. These could be adenomas, but metastatic disease is not excluded. Left renal cyst is noted. Musculoskeletal: No acute bone finding. Mild loss of height of midthoracic vertebral bodies. IMPRESSION: Background pattern of emphysema. Bilateral pleural effusions layering dependently with dependent pulmonary atelectasis. Right middle lobe collapse. Prominence of the right hilar soft tissues, possibly secondary to reactive nodal enlargement. Right hilar mass is not excluded at this time. Follow-up is warranted. Nodular enlargement of the left adrenal gland. These could be benign adenomas or metastatic disease. Follow-up can be performed at the time of lung follow-up. Aortic Atherosclerosis (ICD10-I70.0) and Emphysema (ICD10-J43.9). Electronically Signed   By: Paulina Fusi M.D.   On: 11/18/2017 12:58   Dg Chest Portable 1 View  Result Date: Dec 05, 2017 CLINICAL DATA:  67 y/o  M: Shortness of breath, cough and confusion EXAM: PORTABLE CHEST 1 VIEW COMPARISON:  Chest radiograph 11/11/17 FINDINGS: Bibasilar predominant reticular opacities, right worse than left. No sizable pleural effusion or pneumothorax.Normal cardiomediastinal contours. IMPRESSION: Bibasilar linear opacities, possibly atelectasis or developing infection. Upright PA and lateral radiographs might be helpful for further evaluation. Electronically Signed   By: Deatra Robinson M.D.   On: 2017-12-05 00:53    ASSESSMENT AND PLAN:   Active Problems:   Sepsis (HCC)  1.  Sepsis: Likely source multilobar pneumonia.     Elevated lactic acid   started on broad-spectrum antibiotics.  He is hemodynamically stable.  Follow blood cultures for growth and sensitivities. 2.  Pneumonia: healthcare-associated.   bilateral pleural effusion  Broad-spectrum  antibiotics, continue supplemental oxygen   MRSA PCR is negative so stop vancomycin.   Repeat chest x-ray shows worsening today, done and CT with contrast.   It shows some pleural effusion and suspected mass versus lymph node in the right hilum.   Currently we'll continue broad-spectrum  antibiotics, but once his platelet count improves, we may call pulmonology or radiology consult to do thoracentesis for further diagnostic, cancer versus parapneumonic effusion.  Discussed with Dr. Ardyth Manam, he reviewed CT scan and suggested as currently with some cytopenia there may not be any intervention by him so called consult again once, cytopenia resolves or patient has worsening in condition. 3.  Transaminitis: Possibly consistent with  low flow state of sepsis.  Hydrate with intravenous fluid.   I asked patient wife and son and they denied alcohol use. 4.  Elevated troponin: Etiology is most likely sepsis.     Monitor telemetry.  The patient denies any chest pain.  Manage blood pressure. 5.  Hypertension: Controlled; continue carvedilol 6.  Hyperlipidemia: Continue statin therapy 7.  DVT prophylaxis: SCDs secondary to thrombocytopenia  8.  GI prophylaxis: None 9. Thrombocytopenia   Likely secondary to sepsis, monitor tomorrow.   Send HIT antibodies and call hematology consult.  All the records are reviewed and case discussed with Care Management/Social Workerr. Management plans discussed with the patient, family and they are in agreement.  CODE STATUS: Full.  TOTAL TIME TAKING CARE OF THIS PATIENT: 35 minutes.  Discussed with patient's wife and son in the room.  POSSIBLE D/C IN 1-2 DAYS, DEPENDING ON CLINICAL CONDITION.   Altamese DillingVaibhavkumar Rochella Benner M.D on 11/18/2017   Between 7am to 6pm - Pager - [phone]  After 6pm go to [email or web address] - Social research officer, governmentpassword [email or web address] [email or web address]  [facility]  Office  [phone]  CC: Primary care physician; Ellyn HackShah, Syed Asad A, MD  Note: This dictation was prepared  with Dragon dictation along with smaller phrase technology. Any transcriptional errors that result from this process are unintentional.

## 2017-11-18 NOTE — Progress Notes (Signed)
Pharmacy Antibiotic Note  Hubbard HartshornDelbert M Blaze is a 67 y.o. male admitted on 12/07/2017 with pneumonia.  Pharmacy has been consulted for cefepime dosing.  This is day #2 of antibiotics. Vancomycin has been discontinued. MRSA PCR negative.  Plan: Increase cefepime to 2 g IV q8h  Height: 5\' 10"  (177.8 cm) Weight: 152 lb 16 oz (69.4 kg) IBW/kg (Calculated) : 73  Temp (24hrs), Avg:97.9 F (36.6 C), Min:97.8 F (36.6 C), Max:98 F (36.7 C)  Recent Labs  Lab 11/11/17 1120 11/11/17 2231 11/12/17 0029 2018/03/15 0022 2018/03/15 0200 11/18/17 0429  WBC 3.3*  --  1.9* 3.0*  --  3.2*  CREATININE 1.08  --  0.79 1.18  --  0.97  LATICACIDVEN  --  0.7 0.7 3.2* 3.2* 1.9    Estimated Creatinine Clearance: 72.5 mL/min (by C-G formula based on SCr of 0.97 mg/dL).    No Known Allergies  Antimicrobials this admission: vancomycin 3/5 cefepime 3/5 >>   Dose adjustments this admission:  Microbiology results: 3/5 BCx: No growth 1 day 3/5 MRSA PCR: Negative  Thank you for allowing pharmacy to be a part of this patient's care.  Cindi CarbonMary M Roan Sawchuk, PharmD, BCPS Clinical Pharmacist 11/18/2017 11:07 AM

## 2017-11-19 ENCOUNTER — Inpatient Hospital Stay: Payer: BC Managed Care – PPO | Admitting: Family Medicine

## 2017-11-19 ENCOUNTER — Inpatient Hospital Stay: Payer: BC Managed Care – PPO

## 2017-11-19 DIAGNOSIS — A419 Sepsis, unspecified organism: Principal | ICD-10-CM

## 2017-11-19 DIAGNOSIS — Z720 Tobacco use: Secondary | ICD-10-CM

## 2017-11-19 DIAGNOSIS — D696 Thrombocytopenia, unspecified: Secondary | ICD-10-CM

## 2017-11-19 DIAGNOSIS — J181 Lobar pneumonia, unspecified organism: Secondary | ICD-10-CM

## 2017-11-19 LAB — BLOOD GAS, ARTERIAL
ACID-BASE DEFICIT: 4.1 mmol/L — AB (ref 0.0–2.0)
Bicarbonate: 20.9 mmol/L (ref 20.0–28.0)
FIO2: 0.44
O2 SAT: 89.9 %
PATIENT TEMPERATURE: 37
PCO2 ART: 37 mmHg (ref 32.0–48.0)
pH, Arterial: 7.36 (ref 7.350–7.450)
pO2, Arterial: 61 mmHg — ABNORMAL LOW (ref 83.0–108.0)

## 2017-11-19 LAB — BASIC METABOLIC PANEL
Anion gap: 12 (ref 5–15)
BUN: 35 mg/dL — ABNORMAL HIGH (ref 6–20)
CALCIUM: 7.5 mg/dL — AB (ref 8.9–10.3)
CHLORIDE: 117 mmol/L — AB (ref 101–111)
CO2: 21 mmol/L — AB (ref 22–32)
CREATININE: 1.12 mg/dL (ref 0.61–1.24)
GFR calc non Af Amer: >60 mL/min (ref >60)
Glucose, Bld: 100 mg/dL — ABNORMAL HIGH (ref 65–99)
Potassium: 3.3 mmol/L — ABNORMAL LOW (ref 3.5–5.1)
Sodium: 150 mmol/L — ABNORMAL HIGH (ref 135–145)

## 2017-11-19 LAB — CBC
HEMATOCRIT: 32.9 % — AB (ref 40.0–52.0)
HEMOGLOBIN: 11.1 g/dL — AB (ref 13.0–18.0)
MCH: 34.1 pg — ABNORMAL HIGH (ref 26.0–34.0)
MCHC: 33.7 g/dL (ref 32.0–36.0)
MCV: 101.3 fL — AB (ref 80.0–100.0)
Platelets: 41 10*3/uL — ABNORMAL LOW (ref 150–440)
RBC: 3.25 MIL/uL — ABNORMAL LOW (ref 4.40–5.90)
RDW: 12.9 % (ref 11.5–14.5)
WBC: 3.8 10*3/uL (ref 3.8–10.6)

## 2017-11-19 LAB — AMMONIA: AMMONIA: 32 umol/L (ref 9–35)

## 2017-11-19 LAB — HEPARIN INDUCED PLATELET AB (HIT ANTIBODY): HEPARIN INDUCED PLT AB: 0.171 {OD_unit} (ref 0.000–0.400)

## 2017-11-19 MED ORDER — ORAL CARE MOUTH RINSE
15.0000 mL | Freq: Two times a day (BID) | OROMUCOSAL | Status: DC
  Administered 2017-11-19: 15 mL via OROMUCOSAL

## 2017-11-19 MED ORDER — IPRATROPIUM-ALBUTEROL 0.5-2.5 (3) MG/3ML IN SOLN
3.0000 mL | RESPIRATORY_TRACT | Status: DC
  Administered 2017-11-19: 3 mL via RESPIRATORY_TRACT
  Filled 2017-11-19: qty 3

## 2017-11-19 MED ORDER — NICOTINE 21 MG/24HR TD PT24: 21.0000 mg | MEDICATED_PATCH | Freq: Every day | TRANSDERMAL | Status: DC

## 2017-11-19 MED ORDER — METHYLPREDNISOLONE SODIUM SUCC 125 MG IJ SOLR
60.0000 mg | Freq: Four times a day (QID) | INTRAMUSCULAR | Status: DC
  Administered 2017-11-19: 60 mg via INTRAVENOUS
  Filled 2017-11-19: qty 2

## 2017-11-19 MED ORDER — IPRATROPIUM-ALBUTEROL 0.5-2.5 (3) MG/3ML IN SOLN: 3.0000 mL | Freq: Four times a day (QID) | RESPIRATORY_TRACT | Status: DC | PRN

## 2017-11-19 MED ORDER — POTASSIUM CHLORIDE 10 MEQ/100ML IV SOLN
10.0000 meq | INTRAVENOUS | Status: AC
  Administered 2017-11-19 (×2): 10 meq via INTRAVENOUS
  Filled 2017-11-19 (×2): qty 100

## 2017-11-19 MED ORDER — POTASSIUM CHLORIDE CRYS ER 20 MEQ PO TBCR: 20.0000 meq | EXTENDED_RELEASE_TABLET | Freq: Once | ORAL | Status: DC

## 2017-11-19 MED ORDER — DEXTROSE 5 % IV SOLN
INTRAVENOUS | Status: DC
  Administered 2017-11-19: 18:00:00 via INTRAVENOUS

## 2017-11-19 NOTE — Progress Notes (Signed)
   11/19/17 1715  Clinical Encounter Type  Visited With Patient and family together  Visit Type Initial  Referral From Nurse  Consult/Referral To Chaplain  Spiritual Encounters  Spiritual Needs Brochure;Emotional   CH received an OR to educate PT and family on AD. CH gave family information and will follow up when needed.

## 2017-11-19 NOTE — Progress Notes (Signed)
Patient on HFNC 45% 40L tolerating well. Second abg obtained and called to Dr. Anne HahnWillis 7.35/38/72/93/21. Per Dr Anne HahnWillis, continue current therapy and start nebs. abg in am.

## 2017-11-19 NOTE — Progress Notes (Signed)
Pt tolerating high flow well and work of breathing has improved some.  Cardiac monitoring and continuous pulse ox initiated.  Nebs and solumedrol added to treatment.  Patient is tolerating treatment well.  Family at bedside is very supportive.

## 2017-11-19 NOTE — Consult Note (Signed)
[facility]  [phone] [phone]  ID: Maia Plan OB: Nov 07, 1950  MR#: [id number]  RWE#:[id number]  Patient Care Team: Roselee Nova, MD as PCP - General (Family Medicine) Roselee Nova, MD as Referring Physician (Family Medicine) Christene Lye, MD (General Surgery)  CHIEF COMPLAINT: Thrombocytopenia.  INTERVAL HISTORY: Patient is a 67 year old male who presented to the emergency room complaining of increased shortness of breath.  Patient has a chest x-ray consistent of multilobar pneumonia and neck criteria for sepsis and was subsequently admitted.  His platelet count was noted decreased from 148 on November 11, 2017 down to 41 earlier today.  Patient is lethargic and difficult to arouse.  Family is at bedside, but review of systems is unobtainable.  REVIEW OF SYSTEMS:   Review of Systems  Unable to perform ROS: Acuity of condition    PAST MEDICAL HISTORY: Past Medical History:  Diagnosis Date  . Acid reflux   . Atherosclerotic occlusive disease 2014   LE  . GERD (gastroesophageal reflux disease)   . Heart murmur    NEWLY DX BY PCP-SEEN BY CARDIOLOGIST DR Yvone Neu JAN 2018-NOTE , ECHO IN EPIC  . Hyperlipidemia   . Hypertension     PAST SURGICAL HISTORY: Past Surgical History:  Procedure Laterality Date  . COLONOSCOPY WITH PROPOFOL N/A 09/30/2016   Procedure: COLONOSCOPY WITH PROPOFOL;  Surgeon: Christene Lye, MD;  Location: [facility] ENDOSCOPY;  Service: Endoscopy;  Laterality: N/A;  . HERNIA REPAIR Left 2005  . INGUINAL HERNIA REPAIR Right 11/04/2016   Procedure: HERNIA REPAIR INGUINAL ADULT;  Surgeon: Christene Lye, MD;  Location: [facility] ORS;  Service: General;  Laterality: Right;  . INSERTION OF MESH  11/04/2016   Procedure: INSERTION OF MESH;  Surgeon: Christene Lye, MD;  Location: [facility] ORS;  Service: General;;  . 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 Stroke Father     ADVANCED DIRECTIVES (Y/N):  @ADVDIR @  HEALTH MAINTENANCE: Social History   Tobacco Use  . Smoking status: Current Every Day Smoker    Packs/day: 1.50    Years: 49.00    Pack years: 73.50  . Smokeless tobacco: Never Used  Substance Use Topics  . Alcohol use: No    Alcohol/week: 0.0 oz  . Drug use: No     Colonoscopy:  PAP:  Bone density:  Lipid panel:  No Known Allergies  Current Facility-Administered Medications  Medication Dose Route Frequency Provider Last Rate Last Dose  . acetaminophen (TYLENOL) tablet 650 mg  650 mg Oral Q6H PRN Harrie Foreman, MD       Or  . acetaminophen (TYLENOL) suppository 650 mg  650 mg Rectal Q6H PRN Harrie Foreman, MD      . atorvastatin (LIPITOR) tablet 40 mg  40 mg Oral QHS Harrie Foreman, MD      . carvedilol (COREG) tablet 12.5 mg  12.5 mg Oral Daily Harrie Foreman, MD      . ceFEPIme (MAXIPIME) 2 g in sodium chloride 0.9 % 100 mL IVPB  2 g Intravenous Q8H Lenis Noon, RPH 200 mL/hr at 11/19/17 2255 2 g at 11/19/17 2255  . dextrose 5 % solution   Intravenous Continuous Max Sane, MD 75 mL/hr at 11/19/17 1809    . docusate sodium (COLACE) capsule 100 mg  100 mg  Oral BID Harrie Foreman, MD      . gabapentin (NEURONTIN) capsule 600 mg  600 mg Oral TID PRN Harrie Foreman, MD      . Derrill Memo ON 12/10/17] ipratropium-albuterol (DUONEB) 0.5-2.5 (3) MG/3ML nebulizer solution 3 mL  3 mL Nebulization Q4H Lance Coon, MD      . ipratropium-albuterol (DUONEB) 0.5-2.5 (3) MG/3ML nebulizer solution 3 mL  3 mL Nebulization Q6H PRN Lance Coon, MD      . MEDLINE mouth rinse  15 mL Mouth Rinse BID Max Sane, MD   15 mL at 11/19/17 2256  . meloxicam (MOBIC) tablet 7.5 mg  7.5 mg Oral Daily PRN Harrie Foreman, MD      . methylPREDNISolone sodium succinate (SOLU-MEDROL) 125 mg/2 mL injection 60 mg  60 mg Intravenous Q6H  Lance Coon, MD   60 mg at 11/19/17 2255  . ondansetron (ZOFRAN) tablet 4 mg  4 mg Oral Q6H PRN Harrie Foreman, MD       Or  . ondansetron [facility]) injection 4 mg  4 mg Intravenous Q6H PRN Harrie Foreman, MD      . vitamin B-12 (CYANOCOBALAMIN) tablet 1,000 mcg  1,000 mcg Oral Daily Harrie Foreman, MD        OBJECTIVE: Vitals:   11/19/17 2033 11/19/17 2059  BP:  113/81  Pulse:  92  Resp: (!) 40 18  Temp:  98.5 F (36.9 C)  SpO2:  98%     Body mass index is 21.91 kg/m.    ECOG FS:4 - Bedbound  General: Ill-appearing, no acute distress. Lungs: Coarse bilaterally. Heart: Regular rate and rhythm. No rubs, murmurs, or gallops. Abdomen: Soft, nontender, nondistended. No organomegaly noted, normoactive bowel sounds. Musculoskeletal: No edema, cyanosis, or clubbing. Neuro: Lethargic, difficult to arouse. Skin: No rashes or petechiae noted.    LAB RESULTS:  Lab Results  Component Value Date   NA 150 (H) 11/19/2017   K 3.3 (L) 11/19/2017   CL 117 (H) 11/19/2017   CO2 21 (L) 11/19/2017   GLUCOSE 100 (H) 11/19/2017   BUN 35 (H) 11/19/2017   CREATININE 1.12 11/19/2017   CALCIUM 7.5 (L) 11/19/2017   PROT 5.1 (L) 11/18/2017   ALBUMIN 2.6 (L) 11/18/2017   AST 284 (H) 11/18/2017   ALT 94 (H) 11/18/2017   ALKPHOS 56 11/18/2017   BILITOT 0.9 11/18/2017   GFRNONAA >60 11/19/2017   GFRAA >60 11/19/2017    Lab Results  Component Value Date   WBC 3.8 11/19/2017   NEUTROABS 2.9 11/11/2017   HGB 11.1 (L) 11/19/2017   HCT 32.9 (L) 11/19/2017   MCV 101.3 (H) 11/19/2017   PLT 41 (L) 11/19/2017     STUDIES: Dg Chest 2 View  Result Date: 11/18/2017 CLINICAL DATA:  67 year old male with shortness of breath, undergoing sepsis evaluation. EXAM: CHEST - 2 VIEW COMPARISON:  11/30/2017 and earlier. FINDINGS: Seated AP and lateral views of the chest. There are moderate bilateral layering pleural effusions. There is a small volume of pleural fluid tracking along the major  fissures. Chronic underlying pulmonary hyperinflation. Acute patchy and nodular opacity in the right mid and lower lung. Superimposed diffuse increased bilateral pulmonary interstitial markings. No pneumothorax. Overall ventilation has mildly decreased in both lungs compared to 11/15/2017. Mediastinal contours remain normal. Visualized tracheal air column is within normal limits. Calcified aortic atherosclerosis. Osteopenia. Stable visualized osseous structures. IMPRESSION: 1. Chronic lung disease with hyperinflation.  Superimposed: - moderate-sized layering bilateral pleural effusions. - diffuse increased  pulmonary interstitial opacity with differential considerations of viral/atypical respiratory infection and interstitial edema. - patchy right mid and lower lung opacity suspicious for focal pneumonia. 2. Overall ventilation has mildly worsened compared to yesterday. Electronically Signed   By: Genevie Ann M.D.   On: 11/18/2017 08:46   Dg Chest 2 View  Result Date: 11/11/2017 CLINICAL DATA:  Several day history of dizziness and headache. Several falls recently due to losing his balance. EXAM: CHEST  2 VIEW COMPARISON:  None in PACs FINDINGS: The lungs are mildly hyperinflated. The interstitial markings are coarse. There is biapical pleural thickening. The heart and pulmonary vascularity are normal. There is calcification in the wall of the aortic arch. There is no pleural effusion. There is mild multilevel degenerative disc disease of the thoracic spine. IMPRESSION: Hyperinflation with increased lung markings is most compatible with chronic bronchitis and/or smoking related changes. No definite acute pneumonia nor CHF. Thoracic aortic atherosclerosis. Electronically Signed   By: David  Martinique M.D.   On: 11/11/2017 12:18   Ct Head Wo Contrast  Result Date: 11/19/2017 CLINICAL DATA:  Altered mental status. History of pneumonia. Shortness of breath. EXAM: CT HEAD WITHOUT CONTRAST TECHNIQUE: Contiguous axial images  were obtained from the base of the skull through the vertex without intravenous contrast. COMPARISON:  Brain CT 11/11/2017. FINDINGS: Brain: Ventricles and sulci are appropriate for patient's age. No evidence for acute cortically based infarct, intracranial hemorrhage, mass lesion or mass-effect. Vascular: Internal carotid arterial vascular calcifications. Skull: Intact. Sinuses/Orbits: Paranasal sinuses are unremarkable. Mastoid air cells unremarkable. Orbits are unremarkable. Other: None. IMPRESSION: No acute intracranial process. Electronically Signed   By: Lovey Newcomer M.D.   On: 11/19/2017 15:41   Ct Head Wo Contrast  Result Date: 11/11/2017 CLINICAL DATA:  Pt presents with 4 days of feeling unsteady on his feet, intermittent nausea, decreased appetite, body aches, fevers and chills (100.82F last night), chronic cough. Denies vomiting or diarrhea, abdominal pain, dysuria. Altered level of consciousness. EXAM: CT HEAD WITHOUT CONTRAST TECHNIQUE: Contiguous axial images were obtained from the base of the skull through the vertex without intravenous contrast. COMPARISON:  06/25/2009 FINDINGS: Brain: No evidence of acute infarction, hemorrhage, extra-axial collection, ventriculomegaly, or mass effect. Generalized cerebral atrophy. Periventricular white matter low attenuation likely secondary to microangiopathy. Vascular: Cerebrovascular atherosclerotic calcifications are noted. Skull: Negative for fracture or focal lesion. Sinuses/Orbits: Visualized portions of the orbits are unremarkable. Visualized portions of the paranasal sinuses and mastoid air cells are unremarkable. Other: None. IMPRESSION: 1. No acute intracranial pathology. 2. Chronic microvascular disease and cerebral atrophy. Electronically Signed   By: Kathreen Devoid   On: 11/11/2017 11:58   Ct Chest W Contrast  Result Date: 11/18/2017 CLINICAL DATA:  Diagnosis of pneumonia. Smoking history. Acute respiratory illness. EXAM: CT CHEST WITH CONTRAST  TECHNIQUE: Multidetector CT imaging of the chest was performed during intravenous contrast administration. CONTRAST:  25m ISOVUE-300 IOPAMIDOL (ISOVUE-300) INJECTION 61% COMPARISON:  Radiography same day and previous as distant as 11/11/2017 FINDINGS: Cardiovascular: Heart size is normal. There is aortic atherosclerosis but no aneurysm or dissection. There is extensive coronary artery calcification. No pericardial fluid. Pulmonary arterial opacification is mild-to-moderate, but no pulmonary emboli are seen. Mediastinum/Nodes: No superior mediastinal adenopathy. The patient has a thyroid goiter with intrathoracic extension on the right. Hilar nodes are prominent, more so on the right than the left. This is presumed to be reactive enlargement, but a right hilar mass is not excluded. Lungs/Pleura: Bilateral effusions layering dependently, larger on the right than the  left. Dependent pulmonary atelectasis. Widespread emphysema. Right middle lobe collapse. Dependent atelectasis related to the effusions. As noted above, there is prominence of the right hilar tissues that could simply be a reactive lymph node, though the possibility of a right hilar mass is not excluded, particularly in the setting of right middle lobe collapse. Upper Abdomen: Right adrenal gland is normal. Left adrenal gland is enlarged and nodular. This is nonspecific as evaluated. These could be adenomas, but metastatic disease is not excluded. Left renal cyst is noted. Musculoskeletal: No acute bone finding. Mild loss of height of midthoracic vertebral bodies. IMPRESSION: Background pattern of emphysema. Bilateral pleural effusions layering dependently with dependent pulmonary atelectasis. Right middle lobe collapse. Prominence of the right hilar soft tissues, possibly secondary to reactive nodal enlargement. Right hilar mass is not excluded at this time. Follow-up is warranted. Nodular enlargement of the left adrenal gland. These could be benign  adenomas or metastatic disease. Follow-up can be performed at the time of lung follow-up. Aortic Atherosclerosis (ICD10-I70.0) and Emphysema (ICD10-J43.9). Electronically Signed   By: Nelson Chimes M.D.   On: 11/18/2017 12:58   Dg Chest Portable 1 View  Result Date: 12/10/2017 CLINICAL DATA:  67 y/o  M: Shortness of breath, cough and confusion EXAM: PORTABLE CHEST 1 VIEW COMPARISON:  Chest radiograph 11/11/17 FINDINGS: Bibasilar predominant reticular opacities, right worse than left. No sizable pleural effusion or pneumothorax.Normal cardiomediastinal contours. IMPRESSION: Bibasilar linear opacities, possibly atelectasis or developing infection. Upright PA and lateral radiographs might be helpful for further evaluation. Electronically Signed   By: Ulyses Jarred M.D.   On: 12/07/2017 00:53   Dg Chest Port 1 View  Result Date: 11/11/2017 CLINICAL DATA:  Sudden onset fever. EXAM: PORTABLE CHEST 1 VIEW COMPARISON:  11/11/2017. FINDINGS: Normal sized heart. The lungs remain hyperexpanded. Interval mild, ill-defined opacity in the right mid lung zone. Diffuse osteopenia. Thoracic spine degenerative changes. IMPRESSION: 1. Possible mild pneumonia in the right mid lung zone. 2. Stable changes of COPD. Electronically Signed   By: Claudie Revering M.D.   On: 11/11/2017 21:53    ASSESSMENT: Thrombocytopenia.  PLAN:    1.  Thrombocytopenia: Patient had a normal platelet count of 263 on October 10, 2016.  It was slightly decreased 1 month later on November 11, 2017 to 148.  Upon admission on November 17, 2017, patient's platelet count was found to be 41 and has been generally stable since that time.  He does not appear to have DIC with normal PT and PTT.  HITT antibodies are also within normal limits.  Given his normal platelet count 1 month ago, suspect his acute drop is secondary to his underlying infection and sepsis.  No intervention is needed at this time.  Patient does not require bone marrow biopsy.  He does not  require platelet transfusion at this time, but will consider one if there is overt evidence of bleeding or his platelets trend down to less than 20,000.  Continue to monitor daily CBC.  If patient's symptoms resolved, but his platelets do not begin to trend up, will consider full workup at that point.  Appreciate consult, will follow.   Lloyd Huger, MD   11/19/2017 11:03 PM

## 2017-11-19 NOTE — Evaluation (Signed)
Clinical/Bedside Swallow Evaluation Patient Details  Name: Jared Harmon MRN: [id number] Date of Birth: [birth year]  Today's Date: 11/19/2017 Time: SLP Start Time (ACUTE ONLY): 1450 SLP Stop Time (ACUTE ONLY): 1530 SLP Time Calculation (min) (ACUTE ONLY): 40 min  Past Medical History:  Past Medical History:  Diagnosis Date  . Acid reflux   . Atherosclerotic occlusive disease 2014   LE  . GERD (gastroesophageal reflux disease)   . Heart murmur    NEWLY DX BY PCP-SEEN BY CARDIOLOGIST DR Yvone Neu JAN 2018-NOTE , ECHO IN EPIC  . Hyperlipidemia   . Hypertension    Past Surgical History:  Past Surgical History:  Procedure Laterality Date  . COLONOSCOPY WITH PROPOFOL N/A 09/30/2016   Procedure: COLONOSCOPY WITH PROPOFOL;  Surgeon: Christene Lye, MD;  Location: [facility] ENDOSCOPY;  Service: Endoscopy;  Laterality: N/A;  . HERNIA REPAIR Left 2005  . INGUINAL HERNIA REPAIR Right 11/04/2016   Procedure: HERNIA REPAIR INGUINAL ADULT;  Surgeon: Christene Lye, MD;  Location: [facility] ORS;  Service: General;  Laterality: Right;  . INSERTION OF MESH  11/04/2016   Procedure: INSERTION OF MESH;  Surgeon: Christene Lye, MD;  Location: [facility] ORS;  Service: General;;  . 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  The patient was seen in the emergency department last week for the same and treated for pneumonia.  He met criteria for sepsis at that time and again meets criteria for sepsis with this encounter. Noted Chest CT revealed: Bilateral effusions layering dependently, larger on the right than the left. Dependent pulmonary atelectasis. Widespread emphysema. Right middle lobe collapse. Dependent atelectasis related to the effusions. As noted above, there is  prominence of the right hilar tissues that could simply be a reactive lymph node, though the possibility of a right hilar mass is not excluded, particularly in the setting of right middle lobe collapse.    Assessment / Plan / Recommendation Clinical Impression  Pt appears to present w/ oropharyngeal phase dysphagia severely impacted by his declined mental/Cognitive status and poor alertness to oral stimulation/task. Due to his poor awareness to oral stimulation by SLP and to po trials of ice chips, pt is at HIGH risk for aspiration of any oral intake. Recommend strict NPO status; frequent oral care for hygiene and oral stimulation (when awake); strict aspiration precautions. SLP attempted oral care to remove some of the dried mucous secretions, however, pt kept his tongue bunched posteriorly - mouth was quite dry(oral breather). ST services will continue to follow pt's status for ongoing assessment of swallowing function and toleation of an oral diet. Noted Chest CT results; recommended Head CT d/t declined mental status and alertness currently. MD consulted and agreed. NSG updated.  SLP Visit Diagnosis: Dysphagia, oropharyngeal phase (R13.12)    Aspiration Risk  Severe aspiration risk;Risk for inadequate nutrition/hydration    Diet Recommendation  NPO status including medications; oral care w/ strict aspiration precautions  Medication Administration: Via alternative means    Other  Recommendations Recommended Consults: (Dietician f/u) Oral Care Recommendations: Oral care QID;Staff/trained caregiver to provide oral care(w/ aspiration precautions) Other Recommendations: (TBD)   Follow up Recommendations Skilled Nursing facility(TBD)      Frequency and Duration min 3x week  2 weeks  Prognosis Prognosis for Safe Diet Advancement: Guarded Barriers to Reach Goals: Cognitive deficits;Severity of deficits(lethargy)      Swallow Study   General Date of Onset: 12/07/2017 HPI: Pt is a 67  y/o male w/ past medical history of GERD, Tobacco use, hypertension, hyperlipidemia and heart murmur presents to the emergency department with shortness of breath.  The patient was seen in the emergency department last week for the same and treated for pneumonia.  He met criteria for sepsis at that time and again meets criteria for sepsis with this encounter. Noted Chest CT revealed: Bilateral effusions layering dependently, larger on the right than the left. Dependent pulmonary atelectasis. Widespread emphysema. Right middle lobe collapse. Dependent atelectasis related to the effusions. As noted above, there is prominence of the right hilar tissues that could simply be a reactive lymph node, though the possibility of a right hilar mass is not excluded, particularly in the setting of right middle lobe collapse.  Type of Study: Bedside Swallow Evaluation Previous Swallow Assessment: none reported Diet Prior to this Study: NPO(family reported he has not eaten since the weekend) Temperature Spikes Noted: (wbc 3.8;  temp 99.7 yesterday) Respiratory Status: Nasal cannula(2 liters) History of Recent Intubation: No Behavior/Cognition: Lethargic/Drowsy;Requires cueing;Doesn't follow directions(poorly responsive) Oral Cavity Assessment: Dry;Dried secretions(thickened mucous) Oral Care Completed by SLP: Yes Oral Cavity - Dentition: Missing dentition Vision: (n/a) Self-Feeding Abilities: Total assist Patient Positioning: Upright in bed Baseline Vocal Quality: (nonverbal) Volitional Cough: Cognitively unable to elicit Volitional Swallow: Unable to elicit    Oral/Motor/Sensory Function Overall Oral Motor/Sensory Function: (pt did not follow through to assess)   Ice Chips Ice chips: Impaired Presentation: Spoon(fed; 2 trials) Oral Phase Impairments: Poor awareness of bolus;Reduced lingual movement/coordination Oral Phase Functional Implications: Oral holding(no lingual or oral response) Pharyngeal Phase  Impairments: (removed the ice chip) Other Comments: nothing further attempted   Thin Liquid Thin Liquid: Not tested    Nectar Thick Nectar Thick Liquid: Not tested   Honey Thick Honey Thick Liquid: Not tested   Puree Puree: Not tested   Solid   GO   Solid: Not tested        Orinda Kenner, MS, CCC-SLP Watson,Katherine 11/19/2017,6:13 PM

## 2017-11-19 NOTE — Progress Notes (Signed)
[facility] [place] at [facility]   PATIENT NAME: Jared Harmon    MR#:  [id number]  DATE OF BIRTH:  Mar 10, 1951  SUBJECTIVE:  CHIEF COMPLAINT:   Chief Complaint  Patient presents with  . Shortness of Breath  . Tachycardia  very lethargic for me, wife and friend at bedside.  REVIEW OF SYSTEMS:  Unable to get as very lethargic ROS  DRUG ALLERGIES:  No Known Allergies  VITALS:  Blood pressure 129/73, pulse (!) 116, temperature 98.6 F (37 C), temperature source Oral, resp. rate (!) 24, height 5\' 10"  (1.778 m), weight 69.3 kg (152 lb 11.2 oz), SpO2 92 %.  PHYSICAL EXAMINATION:  GENERAL:  67 y.o.-year-old patient lying in the bed with no acute distress.  EYES: Pupils equal, round, reactive to light and accommodation. No scleral icterus. Extraocular muscles intact.  HEENT: Head atraumatic, normocephalic. Oropharynx and nasopharynx clear.  NECK:  Supple, no jugular venous distention. No thyroid enlargement, no tenderness.  LUNGS: Normal breath sounds bilaterally, no wheezing, bilateral crepitation. No use of accessory muscles of respiration. On supplemental oxygen. CARDIOVASCULAR: S1, S2 normal. No murmurs, rubs, or gallops.  ABDOMEN: Soft, nontender, nondistended. Bowel sounds present. No organomegaly or mass.  EXTREMITIES: No pedal edema, cyanosis, or clubbing.  NEUROLOGIC: Cranial nerves II through XII are intact. Muscle strength 3-4/5 in all extremities. Sensation intact. Gait not checked. Some shaking. PSYCHIATRIC: The patient is lethargic and barely arousable. SKIN: No obvious rash, lesion, or ulcer.   Physical Exam LABORATORY PANEL:   CBC Recent Labs  Lab 11/19/17 0517  WBC 3.8  HGB 11.1*  HCT 32.9*  PLT 41*   ------------------------------------------------------------------------------------------------------------------  Chemistries  Recent Labs  Lab 11/18/17 0429 11/19/17 0517  NA 140 150*  K 3.4* 3.3*  CL 110 117*  CO2 23 21*   GLUCOSE 108* 100*  BUN 33* 35*  CREATININE 0.97 1.12  CALCIUM 7.1* 7.5*  AST 284*  --   ALT 94*  --   ALKPHOS 56  --   BILITOT 0.9  --    ------------------------------------------------------------------------------------------------------------------  Cardiac Enzymes Recent Labs  Lab 2017-11-26 0022  TROPONINI 0.05*   ------------------------------------------------------------------------------------------------------------------  RADIOLOGY:  Dg Chest 2 View  Result Date: 11/18/2017 CLINICAL DATA:  67 year old male with shortness of breath, undergoing sepsis evaluation. EXAM: CHEST - 2 VIEW COMPARISON:  11/26/17 and earlier. FINDINGS: Seated AP and lateral views of the chest. There are moderate bilateral layering pleural effusions. There is a small volume of pleural fluid tracking along the major fissures. Chronic underlying pulmonary hyperinflation. Acute patchy and nodular opacity in the right mid and lower lung. Superimposed diffuse increased bilateral pulmonary interstitial markings. No pneumothorax. Overall ventilation has mildly decreased in both lungs compared to 2017-11-26. Mediastinal contours remain normal. Visualized tracheal air column is within normal limits. Calcified aortic atherosclerosis. Osteopenia. Stable visualized osseous structures. IMPRESSION: 1. Chronic lung disease with hyperinflation.  Superimposed: - moderate-sized layering bilateral pleural effusions. - diffuse increased pulmonary interstitial opacity with differential considerations of viral/atypical respiratory infection and interstitial edema. - patchy right mid and lower lung opacity suspicious for focal pneumonia. 2. Overall ventilation has mildly worsened compared to yesterday. Electronically Signed   By: Odessa Fleming M.D.   On: 11/18/2017 08:46   Ct Head Wo Contrast  Result Date: 11/19/2017 CLINICAL DATA:  Altered mental status. History of pneumonia. Shortness of breath. EXAM: CT HEAD WITHOUT CONTRAST  TECHNIQUE: Contiguous axial images were obtained from the base of the skull through the vertex without intravenous contrast. COMPARISON:  Brain CT 11/11/2017. FINDINGS: Brain: Ventricles and sulci are appropriate for patient's age. No evidence for acute cortically based infarct, intracranial hemorrhage, mass lesion or mass-effect. Vascular: Internal carotid arterial vascular calcifications. Skull: Intact. Sinuses/Orbits: Paranasal sinuses are unremarkable. Mastoid air cells unremarkable. Orbits are unremarkable. Other: None. IMPRESSION: No acute intracranial process. Electronically Signed   By: Annia Beltrew  Davis M.D.   On: 11/19/2017 15:41   Ct Chest W Contrast  Result Date: 11/18/2017 CLINICAL DATA:  Diagnosis of pneumonia. Smoking history. Acute respiratory illness. EXAM: CT CHEST WITH CONTRAST TECHNIQUE: Multidetector CT imaging of the chest was performed during intravenous contrast administration. CONTRAST:  75mL ISOVUE-300 IOPAMIDOL (ISOVUE-300) INJECTION 61% COMPARISON:  Radiography same day and previous as distant as 11/11/2017 FINDINGS: Cardiovascular: Heart size is normal. There is aortic atherosclerosis but no aneurysm or dissection. There is extensive coronary artery calcification. No pericardial fluid. Pulmonary arterial opacification is mild-to-moderate, but no pulmonary emboli are seen. Mediastinum/Nodes: No superior mediastinal adenopathy. The patient has a thyroid goiter with intrathoracic extension on the right. Hilar nodes are prominent, more so on the right than the left. This is presumed to be reactive enlargement, but a right hilar mass is not excluded. Lungs/Pleura: Bilateral effusions layering dependently, larger on the right than the left. Dependent pulmonary atelectasis. Widespread emphysema. Right middle lobe collapse. Dependent atelectasis related to the effusions. As noted above, there is prominence of the right hilar tissues that could simply be a reactive lymph node, though the possibility  of a right hilar mass is not excluded, particularly in the setting of right middle lobe collapse. Upper Abdomen: Right adrenal gland is normal. Left adrenal gland is enlarged and nodular. This is nonspecific as evaluated. These could be adenomas, but metastatic disease is not excluded. Left renal cyst is noted. Musculoskeletal: No acute bone finding. Mild loss of height of midthoracic vertebral bodies. IMPRESSION: Background pattern of emphysema. Bilateral pleural effusions layering dependently with dependent pulmonary atelectasis. Right middle lobe collapse. Prominence of the right hilar soft tissues, possibly secondary to reactive nodal enlargement. Right hilar mass is not excluded at this time. Follow-up is warranted. Nodular enlargement of the left adrenal gland. These could be benign adenomas or metastatic disease. Follow-up can be performed at the time of lung follow-up. Aortic Atherosclerosis (ICD10-I70.0) and Emphysema (ICD10-J43.9). Electronically Signed   By: Paulina FusiMark  Shogry M.D.   On: 11/18/2017 12:58    ASSESSMENT AND PLAN:   Active Problems:   Sepsis (HCC)  * Hypernatreamia: will change IVFs to D5W as Na 150  * Lethargy/Acute Metabolic encephalopathy - CT head STAT - neg, check Ammonia - will c/s Neuro & Psych (wife is giving me story that he may be severely depressed/withdrawn and has had mood swings) - he's not eating/drinking much  1.  Sepsis: Likely source multilobar pneumonia.     Elevated lactic acid  - continue broad-spectrum antibiotics.  He is hemodynamically stable.  Follow blood cultures for growth and sensitivities.  2.  Pneumonia: healthcare-associated.   bilateral pleural effusion  Broad-spectrum antibiotics, continue supplemental oxygen   MRSA PCR is negative so stop vancomycin.   Repeat chest x-ray shows worsening today, done and CT with contrast.   It shows some pleural effusion and suspected mass versus lymph node in the right hilum.   Currently we'll continue  broad-spectrum antibiotics, but once his platelet count improves, can consider throacentesis  3.  Transaminitis: Possibly consistent with  low flow state of sepsis.  Hydrate with intravenous fluid.  4.  Elevated troponin: Etiology  is most likely sepsis.     Monitor telemetry.  The patient denies any chest pain.  Manage blood pressure. 5.  Hypertension: Controlled; continue carvedilol 6.  Hyperlipidemia: Continue statin therapy 7.  DVT prophylaxis: SCDs secondary to thrombocytopenia  8.  GI prophylaxis: None 9. Thrombocytopenia   Likely secondary to sepsis, monitor tomorrow. 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 Management plans discussed with the patient, family and they are in agreement.  CODE STATUS: DNR (based on my d/w patient's wife - HCPOA)   TOTAL TIME TAKING CARE OF THIS PATIENT: 35 minutes.  Discussed with patient's wife and son in the room.  POSSIBLE D/C IN 1-2 DAYS, DEPENDING ON CLINICAL CONDITION.   Delfino Lovett M.D on 11/19/2017   Between 7am to 6pm - Pager - [phone]  After 6pm go to [email or web address] - Social research officer, government  [facility]  Office  [phone]  CC: Primary care physician; Ellyn Hack, MD  Note: This dictation was prepared with Dragon dictation along with smaller phrase technology. Any transcriptional errors that result from this process are unintentional.

## 2017-11-19 NOTE — Progress Notes (Addendum)
Family Meeting Note  Advance Directive:yes  Today a meeting took place with the Patient and spouse.  Patient is unable to participate due ZO:XWRUEAto:Lacked capacity Lethargic   The following clinical team members were present during this meeting:MD  The following were discussed:Patient's diagnosis: [age] y male with hypertension, hyperlipidemia and heart murmur admitted for shortness of breath  * Sepsis  * Hypernatreamia * Lethargy/Acute Metabolic encephalopathy * multilobar pneumonia.  * Transaminitis  Patient's progosis: > 12 months and Goals for treatment: DNR  Additional follow-up to be provided: Wife (HC-poa) at bedside agreed for DNR. Palliative care c/s  Time spent during discussion:20 minutes  Delfino LovettVipul Elvis Boot, MD

## 2017-11-19 NOTE — Plan of Care (Signed)
  Progressing Fluid Volume: Hemodynamic stability will improve 11/19/2017 0443 - Progressing by Peterson LombardKlenner, Osiris Charles C, RN Clinical Measurements: Diagnostic test results will improve 11/19/2017 0443 - Progressing by Peterson LombardKlenner, Edrian Melucci C, RN Education: Knowledge of General Education information will improve 11/19/2017 0443 - Progressing by Peterson LombardKlenner, Benigna Delisi C, RN   Not Progressing Clinical Measurements: Signs and symptoms of infection will decrease 11/19/2017 0443 - Not Progressing by Peterson LombardKlenner, Tim Wilhide C, RN Respiratory: Ability to maintain adequate ventilation will improve 11/19/2017 0443 - Not Progressing by Peterson LombardKlenner, Evie Crumpler C, RN Health Behavior/Discharge Planning: Ability to manage health-related needs will improve 11/19/2017 0443 - Not Progressing by Peterson LombardKlenner, Clair Bardwell C, RN Clinical Measurements: Ability to maintain clinical measurements within normal limits will improve 11/19/2017 0443 - Not Progressing by Peterson LombardKlenner, Madelynn Malson C, RN Diagnostic test results will improve 11/19/2017 0443 - Not Progressing by Peterson LombardKlenner, Yaslin Kirtley C, RN Respiratory complications will improve 11/19/2017 0443 - Not Progressing by Peterson LombardKlenner, Viera Okonski C, RN Activity: Risk for activity intolerance will decrease 11/19/2017 0443 - Not Progressing by Peterson LombardKlenner, Qaadir Kent C, RN Coping: Level of anxiety will decrease 11/19/2017 0443 - Not Progressing by Peterson LombardKlenner, Taylyn Brame C, RN

## 2017-11-19 NOTE — Progress Notes (Signed)
Patients oxygen saturation decreased requiring patient to be increased to 6L O2, 40 respirations per min, accessory muscle use. Wife, multiple sons, and daughter-in-law at bedside. Had conversation with all present and DNR was rescinded. Family decided on limited code, yes to all except defibrillation. Family also requesting ABG. On call MD Elisabeth Pigeon(Vachhani) called and updated, order for STAT ABG placed.   ABG resulted. On call MD Anne Hahn(Willis) called and notified. Order placed for HFNC and recheck ABG in 1 hour. Plan of care will be reevaluated at that time.  Family updated at bedside. Aram Beechamynthia, RN assuming care made aware of all above. RT at bedside 9:05 PM.   Bo McclintockBrewer,Axel Frisk S, RN

## 2017-11-19 NOTE — Progress Notes (Signed)
The nurse had called me. Earlier in Day- after discussion with Dr. Sherryll BurgerShah- family had agreed for DNR, but later more family members arrived and they have changed code to Limited. And requesting for ABG, will check now and decide further course.

## 2017-11-20 ENCOUNTER — Inpatient Hospital Stay: Payer: BC Managed Care – PPO

## 2017-11-20 DIAGNOSIS — R002 Palpitations: Secondary | ICD-10-CM

## 2017-11-20 LAB — BLOOD GAS, ARTERIAL
Acid-base deficit: 15.9 mmol/L — ABNORMAL HIGH (ref 0.0–2.0)
Bicarbonate: 17.2 mmol/L — ABNORMAL LOW (ref 20.0–28.0)
FIO2: 100
MECHVT: 500 mL
O2 SAT: 79.9 %
PCO2 ART: 84 mmHg — AB (ref 32.0–48.0)
PEEP: 5 cmH2O
PH ART: 6.92 — AB (ref 7.350–7.450)
Patient temperature: 37
RATE: 15 resp/min
pO2, Arterial: 73 mmHg — ABNORMAL LOW (ref 83.0–108.0)

## 2017-11-20 LAB — CBC
HCT: 34.6 % — ABNORMAL LOW (ref 40.0–52.0)
Hemoglobin: 11 g/dL — ABNORMAL LOW (ref 13.0–18.0)
MCH: 34.3 pg — ABNORMAL HIGH (ref 26.0–34.0)
MCHC: 31.8 g/dL — ABNORMAL LOW (ref 32.0–36.0)
MCV: 107.7 fL — ABNORMAL HIGH (ref 80.0–100.0)
PLATELETS: 45 10*3/uL — AB (ref 150–440)
RBC: 3.21 MIL/uL — AB (ref 4.40–5.90)
RDW: 14.9 % — AB (ref 11.5–14.5)
WBC: 7.4 10*3/uL (ref 3.8–10.6)

## 2017-11-20 LAB — COMPREHENSIVE METABOLIC PANEL
ALT: 105 U/L — AB (ref 17–63)
AST: 314 U/L — AB (ref 15–41)
Albumin: 1.8 g/dL — ABNORMAL LOW (ref 3.5–5.0)
Alkaline Phosphatase: 76 U/L (ref 38–126)
Anion gap: 14 (ref 5–15)
BUN: 47 mg/dL — AB (ref 6–20)
CHLORIDE: 120 mmol/L — AB (ref 101–111)
CO2: 19 mmol/L — AB (ref 22–32)
CREATININE: 1.8 mg/dL — AB (ref 0.61–1.24)
Calcium: 8.4 mg/dL — ABNORMAL LOW (ref 8.9–10.3)
GFR calc Af Amer: 43 mL/min — ABNORMAL LOW (ref >60)
GFR calc non Af Amer: 37 mL/min — ABNORMAL LOW (ref >60)
GLUCOSE: 150 mg/dL — AB (ref 65–99)
POTASSIUM: 5.3 mmol/L — AB (ref 3.5–5.1)
SODIUM: 153 mmol/L — AB (ref 135–145)
Total Bilirubin: 0.6 mg/dL (ref 0.3–1.2)
Total Protein: 4.3 g/dL — ABNORMAL LOW (ref 6.5–8.1)

## 2017-11-20 LAB — BASIC METABOLIC PANEL
ANION GAP: 14 (ref 5–15)
BUN: 45 mg/dL — ABNORMAL HIGH (ref 6–20)
CALCIUM: 8.3 mg/dL — AB (ref 8.9–10.3)
CO2: 21 mmol/L — AB (ref 22–32)
Chloride: 118 mmol/L — ABNORMAL HIGH (ref 101–111)
Creatinine, Ser: 1.86 mg/dL — ABNORMAL HIGH (ref 0.61–1.24)
GFR calc Af Amer: 42 mL/min — ABNORMAL LOW (ref >60)
GFR calc non Af Amer: 36 mL/min — ABNORMAL LOW (ref >60)
GLUCOSE: 155 mg/dL — AB (ref 65–99)
POTASSIUM: 5.3 mmol/L — AB (ref 3.5–5.1)
Sodium: 153 mmol/L — ABNORMAL HIGH (ref 135–145)

## 2017-11-20 LAB — PROCALCITONIN: Procalcitonin: 0.45 ng/mL

## 2017-11-20 LAB — LACTIC ACID, PLASMA: Lactic Acid, Venous: 9.9 mmol/L (ref 0.5–1.9)

## 2017-11-20 LAB — GLUCOSE, CAPILLARY: Glucose-Capillary: 127 mg/dL — ABNORMAL HIGH (ref 65–99)

## 2017-11-20 LAB — PHOSPHORUS: PHOSPHORUS: 10.1 mg/dL — AB (ref 2.5–4.6)

## 2017-11-20 LAB — MAGNESIUM: Magnesium: 2.7 mg/dL — ABNORMAL HIGH (ref 1.7–2.4)

## 2017-11-20 MED ORDER — SODIUM BICARBONATE 8.4 % IV SOLN: 100.0000 meq | Freq: Once | INTRAVENOUS | Status: DC

## 2017-11-20 MED ORDER — ORAL CARE MOUTH RINSE: 15.0000 mL | Freq: Four times a day (QID) | OROMUCOSAL | Status: DC

## 2017-11-20 MED ORDER — SENNOSIDES 8.8 MG/5ML PO SYRP: 5.0000 mL | ORAL_SOLUTION | Freq: Two times a day (BID) | ORAL | Status: DC | PRN

## 2017-11-20 MED ORDER — FENTANYL CITRATE (PF) 100 MCG/2ML IJ SOLN: 50.0000 ug | Freq: Once | INTRAMUSCULAR | Status: DC

## 2017-11-20 MED ORDER — MIDAZOLAM HCL 2 MG/2ML IJ SOLN: 1.0000 mg | INTRAMUSCULAR | Status: DC | PRN

## 2017-11-20 MED ORDER — NOREPINEPHRINE 4 MG/250ML-% IV SOLN: 0.0000 ug/min | INTRAVENOUS | Status: DC

## 2017-11-20 MED ORDER — IPRATROPIUM-ALBUTEROL 0.5-2.5 (3) MG/3ML IN SOLN: 3.0000 mL | Freq: Four times a day (QID) | RESPIRATORY_TRACT | Status: DC

## 2017-11-20 MED ORDER — SODIUM CHLORIDE 0.9 % IV SOLN: 25.0000 ug/h | INTRAVENOUS | Status: DC

## 2017-11-20 MED ORDER — PANTOPRAZOLE SODIUM 40 MG IV SOLR: 40.0000 mg | Freq: Every day | INTRAVENOUS | Status: DC

## 2017-11-20 MED ORDER — BISACODYL 10 MG RE SUPP: 10.0000 mg | Freq: Every day | RECTAL | Status: DC | PRN

## 2017-11-20 MED ORDER — SODIUM CHLORIDE 0.9 % IV SOLN: INTRAVENOUS | Status: DC

## 2017-11-20 MED ORDER — FENTANYL BOLUS VIA INFUSION
25.0000 ug | INTRAVENOUS | Status: DC | PRN
  Filled 2017-11-20: qty 25

## 2017-11-20 MED ORDER — SODIUM BICARBONATE 8.4 % IV SOLN: 50.0000 meq | Freq: Once | INTRAVENOUS | Status: DC

## 2017-11-20 MED ORDER — FENTANYL 2500MCG IN NS 250ML (10MCG/ML) PREMIX INFUSION
25.0000 ug/h | INTRAVENOUS | Status: DC
  Administered 2017-11-20: 100 ug/h via INTRAVENOUS
  Filled 2017-11-20: qty 250

## 2017-11-20 MED ORDER — CHLORHEXIDINE GLUCONATE 0.12% ORAL RINSE (MEDLINE KIT): 15.0000 mL | Freq: Two times a day (BID) | OROMUCOSAL | Status: DC

## 2017-11-20 MED ORDER — BUDESONIDE 0.5 MG/2ML IN SUSP: 0.5000 mg | Freq: Two times a day (BID) | RESPIRATORY_TRACT | Status: DC

## 2017-11-20 MED FILL — Medication: Qty: 1 | Status: AC

## 2017-11-22 LAB — CULTURE, BLOOD (ROUTINE X 2)
CULTURE: NO GROWTH
Culture: NO GROWTH
SPECIAL REQUESTS: ADEQUATE

## 2017-11-25 LAB — CULTURE, BLOOD (ROUTINE X 2)
CULTURE: NO GROWTH
Culture: NO GROWTH
SPECIAL REQUESTS: ADEQUATE

## 2017-11-27 ENCOUNTER — Telehealth: Payer: Self-pay | Admitting: *Deleted

## 2017-11-27 NOTE — Telephone Encounter (Signed)
Death certificate received [place] mail and placed in provider folder for completion. Envelope enclosed to mail original back.

## 2017-11-30 NOTE — Telephone Encounter (Signed)
Death cert placed in envelope provided and placed in the outgoing mail bin up front.

## 2017-12-14 NOTE — Progress Notes (Addendum)
Called early on in the shaft by nursing due to patient's increased respiratory rate and falling oxygen saturations.  We placed the patient on high flow nasal cannula and his oxygen improved.  Repeat ABG showed oxygen level at 70 and his respiratory rates fell below 20.  On my examination of the patient at that time he had bilateral rhonchi, but had improved work of breathing and was resting well at that time.  He was still lethargic and not very verbal, though family at bedside at that time stated he had interacted with them some, and that his mental status seemed to have improved to them.  We place patient on continuous pulse ox and cardiac monitoring at that time.  Shortly thereafter he was noticed to have bradycardia and abnormal rhythm on cardiac monitor.  Nursing went in to evaluate the patient and he was nonresponsive and found to be pulseless.  CODE BLUE was called.  CPR was initiated and ACS protocol was followed.  Patient received 2 doses of epinephrine, 2 doses of atropine.  He had continuous chest compressions during this time.  During intubation he was found to have significant mucous plug in his upper airway.  ROSC was obtained after successful intubation.  Patient was transferred to ICU.  Initially his blood pressure stabilized, however he required initiation of levophed shortly thereafter as his blood pressure fell.  Strong suspicion the patient likely suffered airway blockage due to his mucous plug causing respiratory arrest and hypoxia followed by cardiac arrest.  I had a long conversation with family members, including his wife and 1 of his sons.  This patient's family had been somewhat indecisive about his CODE STATUS.  His wife stated to me that she initially did not have full understanding of what was being asked when she made the patient DNR.  She subsequently reversed the DNR status earlier tonight prior to patient's initial respiratory event.  She stated that she felt patient had said in  the past that he would not want to be resuscitated in the setting of a profound stroke or massive heart attack.  However, we discussed very clearly the possible ramifications of a significant arrest event such as when the patient suffered tonight.  Patient's wife and son were well aware of potential complications that may arise in the next few days in this patient's care.  They understand that further arrest events will significantly reduce this patient's possibility for meaningful recovery.  However, at this time they are hopeful and continuing to support the patient fully and monitor for any improvement or signs that he may recover from his illness and his code event.  [name], Jared Harmon FIELDING [facility] 12/03/2017, 2:30 AM

## 2017-12-14 NOTE — Procedures (Addendum)
Central Venous Catheter Insertion Procedure Note Jared HartshornDelbert M Harmon [id number] 01-21-1951  Procedure: Insertion of Central Venous Catheter Indications: Assessment of intravascular volume, Drug and/or fluid administration and Frequent blood sampling  Procedure Details Consent: Risks of procedure as well as the alternatives and risks of each were explained to the (patient/caregiver).  Consent for procedure obtained. and Unable to obtain consent because of emergent medical necessity. Time Out: Verified patient identification, verified procedure, site/side was marked, verified correct patient position, special equipment/implants available, medications/allergies/relevent history reviewed, required imaging and test results available.  Performed  Maximum sterile technique was used including antiseptics, cap, gloves, gown, hand hygiene, mask and sheet. Skin prep: Chlorhexidine; local anesthetic administered A antimicrobial bonded/coated triple lumen catheter was placed in the right femoral vein due to emergent situation using the Seldinger technique.  Evaluation Blood flow good Complications: No apparent complications Patient did tolerate procedure well. Chest X-ray ordered to verify placement.  CXR: not indicated for femoral lines.  Procedure performed under direct supervision of Dr.conforti. Ultrasound utilized for realtime vessel cannulation Jared Harmon ANP-BC Pulmonary and Critical Care Medicine [facility] Pager [phone] or [phone]  NB: This document was prepared using Dragon voice recognition software and may include unintentional dictation errors.    12/11/2017, 6:32 AM

## 2017-12-14 NOTE — Progress Notes (Signed)
   03-31-18 0039  Clinical Encounter Type  Visited With Family;Patient not available  Visit Type Code  Referral From Nurse  Consult/Referral To Chaplain  Spiritual Encounters  Spiritual Needs Prayer;Emotional;Grief support   CH [facility] to Code Blue PG. [facility] was receiving chest compressions and PT's wife was in the corner of the RM unable to move out of RM due to amount of people in RM. PT heart was revived and PT was moved to CCU. CH stayed with family while PT was stabilized. PT's wife and three of son's and two daughter-in-laws are currently present waiting to see PT.

## 2017-12-14 NOTE — Death Summary Note (Signed)
DEATH SUMMARY   Patient Details  Name: Jared Harmon MRN: [id number] DOB: 05-09-1951  Admission/Discharge Information   Admit Date:  11/23/2017  Date of Death: Date of Death: 11/24/2017  Time of Death: Time of Death: 0257  Length of Stay: 3  Referring Physician: Ellyn HackShah, Syed Asad A, MD   Reason(s) for Hospitalization  MULTIFOCAL PNEUMONIA ACUTE HYPOXIC RESPIRATORY FAILURE   Diagnoses  Preliminary cause of death:  Secondary Diagnoses (including complications and co-morbidities):  Active Problems:   Sepsis [facility](HCC) Cardiac arrest x2 Acute hypoxic/Hypercarbic respiratory failure Septic shock Cardiogenic shock Sepsis secondary to pneumonia Multifocal pneumonia  Brief Hospital Course (including significant findings, care, treatment, and services provided and events leading to death)  Jared Harmon is a 67 y.o. year old male who  was admitted on 11/25/2017 with multifocal pneumonia. Patient was admitted on 2/27 and discharged 2/28 with pneumonia and sepsis. He returned on 3/05 with worsening dyspnea and was admitted with multifocal pneumonia and sepsis. Last evening, patient became acutely hypoxic and was placed on HF [name]. Initially he appeared to be doing well but at 0034, he went into cardiac arrest. Initial rhythm was PEA. He was coded for 13 minutes with ROSC after he was intubated.Patient received 2 atropine, 2 epinephrine, 1 calcium and 2 bicarb pushes.  Post resuscitation, his blood pressure was stable with systolics in the 120s-130s but then he became hypotensive requiring pressors. He was started on a norepi gtte.  Upon arrival in the ICU, patient's systolic blood pressure was in the low 80s with MAPs in the low 60s. He had been started on levophed post-resuscitation. This was titrated up to a MAP of 65 or greater. A right femoral TLC was placed. He was also given a 1L fluid bolus. His ABG showed severe respiratory acidosis. Vent changes were made and he was given amps of bicarb. Post  code labs were sent. I was in the process of placing a right femoral A-line when patient's HR dropped to low 30s and then to zero at 0240. A code blue was called and CPR was initiated. [name] and Dr [name] notified.ED attending Dr [name] arrived and took over the running of the code. After about 2 minutes of CPR, the family indicated that they wanted only CPR but no medications. CPR only was continued for about 10 minutes. Patient had a platelet count of 41 prior to cardiac arrest. During CPR, there was copious amounts of blood coming out of the ETT and oral cavity. Medications were resumed after about 12 minutes of CPR. Patient received 5 doses of epinephrine, 3 amps of bicarb and fluid boluses. After 20 minutes of resuscitation, the code was called and patient was pronounced at 0257.   Pertinent Labs and Studies  Significant Diagnostic Studies Dg Chest 2 View  Result Date: 11/18/2017 CLINICAL DATA:  67 year old male with shortness of breath, undergoing sepsis evaluation. EXAM: CHEST - 2 VIEW COMPARISON:  01/18/18 and earlier. FINDINGS: Seated AP and lateral views of the chest. There are moderate bilateral layering pleural effusions. There is a small volume of pleural fluid tracking along the major fissures. Chronic underlying pulmonary hyperinflation. Acute patchy and nodular opacity in the right mid and lower lung. Superimposed diffuse increased bilateral pulmonary interstitial markings. No pneumothorax. Overall ventilation has mildly decreased in both lungs compared to 01/18/18. Mediastinal contours remain normal. Visualized tracheal air column is within normal limits. Calcified aortic atherosclerosis. Osteopenia. Stable visualized osseous structures. IMPRESSION: 1. Chronic lung disease with hyperinflation.  Superimposed: - moderate-sized  layering bilateral pleural effusions. - diffuse increased pulmonary interstitial opacity with differential considerations of viral/atypical respiratory infection and  interstitial edema. - patchy right mid and lower lung opacity suspicious for focal pneumonia. 2. Overall ventilation has mildly worsened compared to yesterday. Electronically Signed   By: Odessa Fleming M.D.   On: 11/18/2017 08:46   Dg Chest 2 View  Result Date: 11/11/2017 CLINICAL DATA:  Several day history of dizziness and headache. Several falls recently due to losing his balance. EXAM: CHEST  2 VIEW COMPARISON:  None in PACs FINDINGS: The lungs are mildly hyperinflated. The interstitial markings are coarse. There is biapical pleural thickening. The heart and pulmonary vascularity are normal. There is calcification in the wall of the aortic arch. There is no pleural effusion. There is mild multilevel degenerative disc disease of the thoracic spine. IMPRESSION: Hyperinflation with increased lung markings is most compatible with chronic bronchitis and/or smoking related changes. No definite acute pneumonia nor CHF. Thoracic aortic atherosclerosis. Electronically Signed   By: David  [place] M.D.   On: 11/11/2017 12:18   Ct Head Wo Contrast  Result Date: 11/19/2017 CLINICAL DATA:  Altered mental status. History of pneumonia. Shortness of breath. EXAM: CT HEAD WITHOUT CONTRAST TECHNIQUE: Contiguous axial images were obtained from the base of the skull through the vertex without intravenous contrast. COMPARISON:  Brain CT 11/11/2017. FINDINGS: Brain: Ventricles and sulci are appropriate for patient's age. No evidence for acute cortically based infarct, intracranial hemorrhage, mass lesion or mass-effect. Vascular: Internal carotid arterial vascular calcifications. Skull: Intact. Sinuses/Orbits: Paranasal sinuses are unremarkable. Mastoid air cells unremarkable. Orbits are unremarkable. Other: None. IMPRESSION: No acute intracranial process. Electronically Signed   By: Annia Belt M.D.   On: 11/19/2017 15:41   Ct Head Wo Contrast  Result Date: 11/11/2017 CLINICAL DATA:  Pt presents with 4 days of feeling unsteady on  his feet, intermittent nausea, decreased appetite, body aches, fevers and chills (100.50F last night), chronic cough. Denies vomiting or diarrhea, abdominal pain, dysuria. Altered level of consciousness. EXAM: CT HEAD WITHOUT CONTRAST TECHNIQUE: Contiguous axial images were obtained from the base of the skull through the vertex without intravenous contrast. COMPARISON:  06/25/2009 FINDINGS: Brain: No evidence of acute infarction, hemorrhage, extra-axial collection, ventriculomegaly, or mass effect. Generalized cerebral atrophy. Periventricular white matter low attenuation likely secondary to microangiopathy. Vascular: Cerebrovascular atherosclerotic calcifications are noted. Skull: Negative for fracture or focal lesion. Sinuses/Orbits: Visualized portions of the orbits are unremarkable. Visualized portions of the paranasal sinuses and mastoid air cells are unremarkable. Other: None. IMPRESSION: 1. No acute intracranial pathology. 2. Chronic microvascular disease and cerebral atrophy. Electronically Signed   By: Elige Ko   On: 11/11/2017 11:58   Ct Chest W Contrast  Result Date: 11/18/2017 CLINICAL DATA:  Diagnosis of pneumonia. Smoking history. Acute respiratory illness. EXAM: CT CHEST WITH CONTRAST TECHNIQUE: Multidetector CT imaging of the chest was performed during intravenous contrast administration. CONTRAST:  75mL ISOVUE-300 IOPAMIDOL (ISOVUE-300) INJECTION 61% COMPARISON:  Radiography same day and previous as distant as 11/11/2017 FINDINGS: Cardiovascular: Heart size is normal. There is aortic atherosclerosis but no aneurysm or dissection. There is extensive coronary artery calcification. No pericardial fluid. Pulmonary arterial opacification is mild-to-moderate, but no pulmonary emboli are seen. Mediastinum/Nodes: No superior mediastinal adenopathy. The patient has a thyroid goiter with intrathoracic extension on the right. Hilar nodes are prominent, more so on the right than the left. This is presumed  to be reactive enlargement, but a right hilar mass is not excluded. Lungs/Pleura: Bilateral effusions  layering dependently, larger on the right than the left. Dependent pulmonary atelectasis. Widespread emphysema. Right middle lobe collapse. Dependent atelectasis related to the effusions. As noted above, there is prominence of the right hilar tissues that could simply be a reactive lymph node, though the possibility of a right hilar mass is not excluded, particularly in the setting of right middle lobe collapse. Upper Abdomen: Right adrenal gland is normal. Left adrenal gland is enlarged and nodular. This is nonspecific as evaluated. These could be adenomas, but metastatic disease is not excluded. Left renal cyst is noted. Musculoskeletal: No acute bone finding. Mild loss of height of midthoracic vertebral bodies. IMPRESSION: Background pattern of emphysema. Bilateral pleural effusions layering dependently with dependent pulmonary atelectasis. Right middle lobe collapse. Prominence of the right hilar soft tissues, possibly secondary to reactive nodal enlargement. Right hilar mass is not excluded at this time. Follow-up is warranted. Nodular enlargement of the left adrenal gland. These could be benign adenomas or metastatic disease. Follow-up can be performed at the time of lung follow-up. Aortic Atherosclerosis (ICD10-I70.0) and Emphysema (ICD10-J43.9). Electronically Signed   By: Paulina Fusi M.D.   On: 11/18/2017 12:58   Dg Chest Port 1 View  Result Date: 12/20/2017 CLINICAL DATA:  Acute respiratory failure.  Intubation. EXAM: PORTABLE CHEST 1 VIEW COMPARISON:  Radiographs and CT 11/18/2017 FINDINGS: Endotracheal tube tip at the thoracic inlet. Unchanged heart size and mediastinal contours. Worsening perihilar aeration with increasing reticular opacities from prior exam suggesting pulmonary edema or aspiration superimposed on emphysema. Lung bases excluded from the field of view. Biapical pleuroparenchymal  scarring. IMPRESSION: 1. Endotracheal tube tip at the thoracic inlet. 2. Increasing perihilar reticular opacities from prior exam suggesting pulmonary edema or aspiration superimposed on emphysema. 3. Lung bases excluded from the field of view. Electronically Signed   By: Rubye Oaks M.D.   On: 2017/12/20 02:38   Dg Chest Portable 1 View  Result Date: 11/28/2017 CLINICAL DATA:  67 y/o  M: Shortness of breath, cough and confusion EXAM: PORTABLE CHEST 1 VIEW COMPARISON:  Chest radiograph 11/11/17 FINDINGS: Bibasilar predominant reticular opacities, right worse than left. No sizable pleural effusion or pneumothorax.Normal cardiomediastinal contours. IMPRESSION: Bibasilar linear opacities, possibly atelectasis or developing infection. Upright PA and lateral radiographs might be helpful for further evaluation. Electronically Signed   By: Deatra Robinson M.D.   On: 12/11/2017 00:53   Dg Chest Port 1 View  Result Date: 11/11/2017 CLINICAL DATA:  Sudden onset fever. EXAM: PORTABLE CHEST 1 VIEW COMPARISON:  11/11/2017. FINDINGS: Normal sized heart. The lungs remain hyperexpanded. Interval mild, ill-defined opacity in the right mid lung zone. Diffuse osteopenia. Thoracic spine degenerative changes. IMPRESSION: 1. Possible mild pneumonia in the right mid lung zone. 2. Stable changes of COPD. Electronically Signed   By: Beckie Salts M.D.   On: 11/11/2017 21:53    Microbiology Recent Results (from the past 240 hour(s))  CULTURE, BLOOD (ROUTINE X 2) w Reflex to ID Panel     Status: None   Collection Time: 11/11/17  9:22 PM  Result Value Ref Range Status   Specimen Description BLOOD LEFT ANTECUBITAL  Final   Special Requests   Final    BOTTLES DRAWN AEROBIC AND ANAEROBIC Blood Culture adequate volume   Culture   Final    NO GROWTH 5 DAYS Performed at [facility], [address] [place], [place] [address]    Report Status 11/16/2017 FINAL  Final  CULTURE, BLOOD (ROUTINE X 2) w Reflex to ID Panel  Status: None   Collection Time: 11/11/17  9:22 PM  Result Value Ref Range Status   Specimen Description BLOOD BLOOD LEFT WRIST  Final   Special Requests   Final    BOTTLES DRAWN AEROBIC AND ANAEROBIC Blood Culture results may not be optimal due to an inadequate volume of blood received in culture bottles   Culture   Final    NO GROWTH 5 DAYS Performed at [facility], [address] [place], [place] [address]    Report Status 11/16/2017 FINAL  Final  MRSA PCR Screening     Status: None   Collection Time: 11/11/17 10:55 PM  Result Value Ref Range Status   MRSA by PCR NEGATIVE NEGATIVE Final    Comment:        The GeneXpert MRSA Assay (FDA approved for NASAL specimens only), is one component of a comprehensive MRSA colonization surveillance program. It is not intended to diagnose MRSA infection nor to guide or monitor treatment for MRSA infections. Performed at [facility], [address] [place], [place] [address]   Blood culture (routine x 2)     Status: None (Preliminary result)   Collection Time: 12-02-2017 12:22 AM  Result Value Ref Range Status   Specimen Description BLOOD BLOOD RIGHT WRIST  Final   Special Requests   Final    BOTTLES DRAWN AEROBIC AND ANAEROBIC Blood Culture adequate volume   Culture   Final    NO GROWTH 3 DAYS Performed at [facility], [address] [place], [place] [address]    Report Status PENDING  Incomplete  Blood culture (routine x 2)     Status: None (Preliminary result)   Collection Time: December 02, 2017 12:22 AM  Result Value Ref Range Status   Specimen Description BLOOD LEFT AC  Final   Special Requests   Final    BOTTLES DRAWN AEROBIC AND ANAEROBIC Blood Culture results may not be optimal due to an excessive volume of blood received in culture bottles   Culture   Final    NO GROWTH 3 DAYS Performed at [facility], [address], [place], [place] [address]    Report Status PENDING  Incomplete   MRSA PCR Screening     Status: None   Collection Time: 12/02/2017 10:21 AM  Result Value Ref Range Status   MRSA by PCR NEGATIVE NEGATIVE Final    Comment:        The GeneXpert MRSA Assay (FDA approved for NASAL specimens only), is one component of a comprehensive MRSA colonization surveillance program. It is not intended to diagnose MRSA infection nor to guide or monitor treatment for MRSA infections. Performed at [facility], [address] [place], [place] [address]   Culture, blood (Routine X 2) w Reflex to ID Panel     Status: None (Preliminary result)   Collection Time: 11/26/2017  2:27 AM  Result Value Ref Range Status   Specimen Description BLOOD LEG  Final   Special Requests   Final    BOTTLES DRAWN AEROBIC AND ANAEROBIC Blood Culture adequate volume   Culture   Final    NO GROWTH < 12 HOURS Performed at [facility], [address] [place], [place] [address]    Report Status PENDING  Incomplete  Culture, blood (Routine X 2) w Reflex to ID Panel     Status: None (Preliminary result)   Collection Time: 12/07/2017  2:28 AM  Result Value Ref Range Status   Specimen Description  BLOOD LEG  Final   Special Requests   Final    BOTTLES DRAWN AEROBIC AND ANAEROBIC Blood Culture results may not be optimal due to an inadequate volume of blood received in culture bottles   Culture   Final    NO GROWTH < 12 HOURS Performed at [facility], [address] [place], [place] [address]    Report Status PENDING  Incomplete    Lab Basic Metabolic Panel: Recent Labs  Lab 11/30/2017 0022 11/18/17 0429 11/19/17 0517 Dec 08, 2017 0228 2017/12/08 0236  NA 134* 140 150* 153* 153*  K 3.6 3.4* 3.3* 5.3* 5.3*  CL 96* 110 117* 120* 118*  CO2 25 23 21* 19* 21*  GLUCOSE 140* 108* 100* 150* 155*  BUN 34* 33* 35* 47* 45*  CREATININE 1.18 0.97 1.12 1.80* 1.86*  CALCIUM 7.6* 7.1* 7.5* 8.4* 8.3*  MG  --   --   --  2.7*  --   PHOS  --   --   --  10.1*  --    Liver  Function Tests: Recent Labs  Lab 11/30/2017 0022 11/18/17 0429 2017/12/08 0228  AST 265* 284* 314*  ALT 92* 94* 105*  ALKPHOS 62 56 76  BILITOT 1.1 0.9 0.6  PROT 5.9* 5.1* 4.3*  ALBUMIN 3.0* 2.6* 1.8*   No results for input(s): LIPASE, AMYLASE in the last 168 hours. Recent Labs  Lab 11/19/17 1617  AMMONIA 32   CBC: Recent Labs  Lab 11/28/2017 0022 11/18/17 0429 11/19/17 0517 Dec 08, 2017 0228  WBC 3.0* 3.2* 3.8 7.4  HGB 14.1 11.8* 11.1* 11.0*  HCT 41.2 34.3* 32.9* 34.6*  MCV 99.3 99.4 101.3* 107.7*  PLT 41* 38* 41* 45*   Cardiac Enzymes: Recent Labs  Lab 11/15/2017 0022  TROPONINI 0.05*   Sepsis Labs: Recent Labs  Lab 11/22/2017 0022 11/14/2017 0200 11/18/17 0429 11/19/17 0517 Dec 08, 2017 0228 December 08, 2017 0236  PROCALCITON  --   --   --   --   --  0.45  WBC 3.0*  --  3.2* 3.8 7.4  --   LATICACIDVEN 3.2* 3.2* 1.9  --   --  9.9*    Procedures/Operations  Right femoral TLC Left femoral A-Line aborted as patient coded during procedure Magdalene S. [facility] ANP-BC Pulmonary and Critical Care Medicine [facility] Pager [phone] or [phone]  NB: This document was prepared using Dragon voice recognition software and may include unintentional dictation errors.    2017-12-08, 9:10 AM

## 2017-12-14 NOTE — Death Summary Note (Signed)
Pt was having ART line inserted in left groin when heart became bradycardic and pulse was lost. Code was called, team promptly arrived, and code was started. see code note for details. Code was stopped and Pt was pronounced dead at 0257 by Marylou Flesherukov, Magadalene NP. Family is aware and currently waiting to see Pt.

## 2017-12-14 NOTE — Progress Notes (Addendum)
Nurse noted patient's heart rate dropping on heart monitor and went to check on patient.  Pt unresponsive.  Noted no pulse rate and immediately started chest compressions.  Code Blue called and compressions continued.  Pulse revived and pt transferred to CCU.  Report given to nurse Elon JesterMichele.  Family moved to quiet room for MD to consult with.  All belongings removed from room and taken to ICU5.

## 2017-12-14 NOTE — Consult Note (Addendum)
PULMONARY / CRITICAL CARE MEDICINE   Name: Jared Harmon MRN: [id number] DOB: [birth year]    ADMISSION DATE:  11/26/2017   CONSULTATION DATE:  November 30, 2017  REFERRING MD: Dr. Jannifer Franklin   REASON: CARDIAC ARREST  HISTORY OF PRESENT ILLNESS:   This is a 67 y/o male, current smoker with a medical history as indicated below who was admitted on 12/06/2017 with multifocal pneumonia. Patient was admitted on 2/27 and discharged 2/28 with pneumonia and sepsis. He returned on 3/05 with worsening dyspnea and was admitted with multifocal pneumonia and sepsis. Last evening, patient became acutely hypoxic and was placed on HF [place]. Initially he appeared to be doing well but at [place], he went into cardiac arrest. Initial rhythm was PEA. He was coded for 13 minutes with ROSC after he was intubated.Patient received 2 atropine, 2 epinephrine, 1 calcium and 2 bicarb pushes.  Post resuscitation, his blood pressure was stable with systolics in the 497W-263Z but then he became hypotensive requiring pressors. He was started on a norepi gtte.  Upon arrival in the ICU, patient's systolic blood pressure was in the low 80s with MAPs in the low 60s. He had been started on levophed post-resuscitation. This was titrated up to a MAP of 65 or greater. A right femoral TLC was placed. He was also given a 1L fluid bolus. His ABG showed severe respiratory acidosis. Vent changes were made and he was given amps of bicarb. Post code labs were sent. I was in the process of placing a right femoral A-line when patient's HR dropped to low 30s and then to zero at 0240. A code blue was called and CPR was initiated. [name] and Dr Alva Garnet notified.ED attending Dr Owens Shark arrived and took over the running of the code. After about 2 minutes of CPR, the family indicated that they wanted only CPR but no medications. CPR only was continued for about 10 minutes. Patient had a platelet count of 41 prior to cardiac arrest. During CPR, there was copious amounts of blood  coming out of the ETT and oral cavity. Medications were resumed after about 12 minutes of CPR. Patient received 5 doses of epinephrine, 3 amps of bicarb and fluid boluses. After 20 minutes of resuscitation, the code was called and patient was pronounced at 0257.  PAST MEDICAL HISTORY :  He  has a past medical history of Acid reflux, Atherosclerotic occlusive disease (2014), GERD (gastroesophageal reflux disease), Heart murmur, Hyperlipidemia, and Hypertension.  PAST SURGICAL HISTORY: He  has a past surgical history that includes Hernia repair (Left, 2005); Colonoscopy with propofol (N/A, 09/30/2016); Vascular surgery; Inguinal hernia repair (Right, 11/04/2016); and Insertion of mesh (11/04/2016).  No Known Allergies  No current facility-administered medications on file prior to encounter.    Current Outpatient Medications on File Prior to Encounter  Medication Sig  . acetaminophen (TYLENOL) 500 MG tablet Take 1,500 mg by mouth every 8 (eight) hours as needed for mild pain or headache.   Marland Kitchen aspirin 81 MG tablet Take 81 mg by mouth daily.  Marland Kitchen atorvastatin (LIPITOR) 40 MG tablet Take 1 tablet (40 mg total) by mouth at bedtime.  . carvedilol (COREG) 12.5 MG tablet TAKE 1 TABLET BY MOUTH EVERY DAY  . gabapentin (NEURONTIN) 300 MG capsule TAKE 1-2 CAPSULES BY MOUTH 3 TIMES A DAY AS NEEDED (Patient taking differently: TAKE 2 CAPSULES BY MOUTH 3 TIMES A DAY AS NEEDED)  . levofloxacin (LEVAQUIN) 500 MG tablet Take 1 tablet (500 mg total) by mouth daily.  . meloxicam (  MOBIC) 7.5 MG tablet Take 7.5 mg by mouth daily.  . CVS VITAMIN B12 1000 MCG tablet Take 1,000 mcg by mouth daily.     FAMILY HISTORY:  His indicated that his mother is alive. He indicated that his father is deceased.   SOCIAL HISTORY: He  reports that he has been smoking.  He has a 73.50 pack-year smoking history. he has never used smokeless tobacco. He reports that he does not drink alcohol or use drugs.  REVIEW OF SYSTEMS:   Unable  to obtain as patient is intubated and sedated   VITAL SIGNS: BP 113/81 (BP Location: Left Arm)   Pulse 92   Temp 98.5 F (36.9 C) (Axillary)   Resp 18   Ht '5\' 10"'$  (1.778 m)   Wt 152 lb 11.2 oz (69.3 kg)   SpO2 98%   BMI 21.91 kg/m   HEMODYNAMICS:    VENTILATOR SETTINGS: Vent Mode: PRVC FiO2 (%):  [45 %-100 %] 100 % Set Rate:  [15 bmp-25 bmp] 25 bmp Vt Set:  [500 mL] 500 mL PEEP:  [5 cmH20] 5 cmH20  INTAKE / OUTPUT: I/O last 3 completed shifts: In: 6570.8 [I.V.:5870.8; IV Piggyback:700] Out: 1075 [Urine:1075]  PHYSICAL EXAMINATION: General: acutely ill looking Neuro:  Unresponsive to noxious stimulus HEENT: pupils dilated, and sluggish, no JVD Cardiovascular:  Tachycardic, S1/S2, no MRG,  Lungs:  Diffuse rhonchi in all lung fields Abdomen:  Non-distended, normal bowel sounds Musculoskeletal:  No joint swelling Skin:  Warm and dry  LABS:  BMET Recent Labs  Lab 11/29/2017 0022 11/18/17 0429 11/19/17 0517  NA 134* 140 150*  K 3.6 3.4* 3.3*  CL 96* 110 117*  CO2 25 23 21*  BUN 34* 33* 35*  CREATININE 1.18 0.97 1.12  GLUCOSE 140* 108* 100*    Electrolytes Recent Labs  Lab 12/01/2017 0022 11/18/17 0429 11/19/17 0517  CALCIUM 7.6* 7.1* 7.5*    CBC Recent Labs  Lab 12/04/2017 0022 11/18/17 0429 11/19/17 0517  WBC 3.0* 3.2* 3.8  HGB 14.1 11.8* 11.1*  HCT 41.2 34.3* 32.9*  PLT 41* 38* 41*    Coag's No results for input(s): APTT, INR in the last 168 hours.  Sepsis Markers Recent Labs  Lab 11/28/2017 0022 12/08/2017 0200 11/18/17 0429  LATICACIDVEN 3.2* 3.2* 1.9    ABG Recent Labs  Lab 11/19/17 1931 11-26-17 0135  PHART 7.36 6.92*  PCO2ART 37 84*  PO2ART 61* 73*    Liver Enzymes Recent Labs  Lab 11/18/2017 0022 11/18/17 0429  AST 265* 284*  ALT 92* 94*  ALKPHOS 62 56  BILITOT 1.1 0.9  ALBUMIN 3.0* 2.6*    Cardiac Enzymes Recent Labs  Lab 11/26/2017 0022  TROPONINI 0.05*    Glucose Recent Labs  Lab 11/26/17 0127  GLUCAP  127*    Imaging Ct Head Wo Contrast  Result Date: 11/19/2017 CLINICAL DATA:  Altered mental status. History of pneumonia. Shortness of breath. EXAM: CT HEAD WITHOUT CONTRAST TECHNIQUE: Contiguous axial images were obtained from the base of the skull through the vertex without intravenous contrast. COMPARISON:  Brain CT 11/11/2017. FINDINGS: Brain: Ventricles and sulci are appropriate for patient's age. No evidence for acute cortically based infarct, intracranial hemorrhage, mass lesion or mass-effect. Vascular: Internal carotid arterial vascular calcifications. Skull: Intact. Sinuses/Orbits: Paranasal sinuses are unremarkable. Mastoid air cells unremarkable. Orbits are unremarkable. Other: None. IMPRESSION: No acute intracranial process. Electronically Signed   By: Lovey Newcomer M.D.   On: 11/19/2017 15:41   Dg Chest [facility]  Result Date: 2017/12/04 CLINICAL DATA:  Acute respiratory failure.  Intubation. EXAM: PORTABLE CHEST 1 VIEW COMPARISON:  Radiographs and CT 11/18/2017 FINDINGS: Endotracheal tube tip at the thoracic inlet. Unchanged heart size and mediastinal contours. Worsening perihilar aeration with increasing reticular opacities from prior exam suggesting pulmonary edema or aspiration superimposed on emphysema. Lung bases excluded from the field of view. Biapical pleuroparenchymal scarring. IMPRESSION: 1. Endotracheal tube tip at the thoracic inlet. 2. Increasing perihilar reticular opacities from prior exam suggesting pulmonary edema or aspiration superimposed on emphysema. 3. Lung bases excluded from the field of view. Electronically Signed   By: Jeb Levering M.D.   On: 12-04-2017 02:38    ASSESSMENT  Cardiac arrest x2 Acute hypoxic/Hypercarbic respiratory failure Septic shock Cardiogenic shock Sepsis secondary to pneumonia Multifocal pneumonia PLAN See HPI  FAMILY  - Updates: Met with patient's family after second code to update them of patient's demise  Koty Anctil S. [facility]  ANP-BC Pulmonary and Critical Care Medicine [facility] Pager [phone] or [phone]  NB: This document was prepared using Dragon voice recognition software and may include unintentional dictation errors.   2017-12-04, 3:08 AM

## 2017-12-14 NOTE — Progress Notes (Signed)
   11/19/2017 0235  Clinical Encounter Type  Visited With Family  Visit Type Code  Referral From Nurse  Consult/Referral To Chaplain  Spiritual Encounters  Spiritual Needs Prayer;Emotional;Grief support   CH received the second Code Blue PG for PT. PT expired after family agreed to stop life saving efforts. CH stayed with family and offered support and ministry of presences. CH handed family off to their pastor who came in at 0400.

## 2017-12-14 DEATH — deceased

## 2018-06-21 ENCOUNTER — Ambulatory Visit (INDEPENDENT_AMBULATORY_CARE_PROVIDER_SITE_OTHER): Payer: BC Managed Care – PPO | Admitting: Vascular Surgery

## 2018-06-21 ENCOUNTER — Encounter (INDEPENDENT_AMBULATORY_CARE_PROVIDER_SITE_OTHER): Payer: BC Managed Care – PPO

## 2019-05-24 IMAGING — CR DG CHEST 2V
3 series · 3 of 3 positions shown · non-contrast
Comparison: None in PACs

CLINICAL DATA: Several day history of dizziness and headache.
Several falls recently due to losing his balance.

EXAM:
CHEST  2 VIEW

[chest pa]
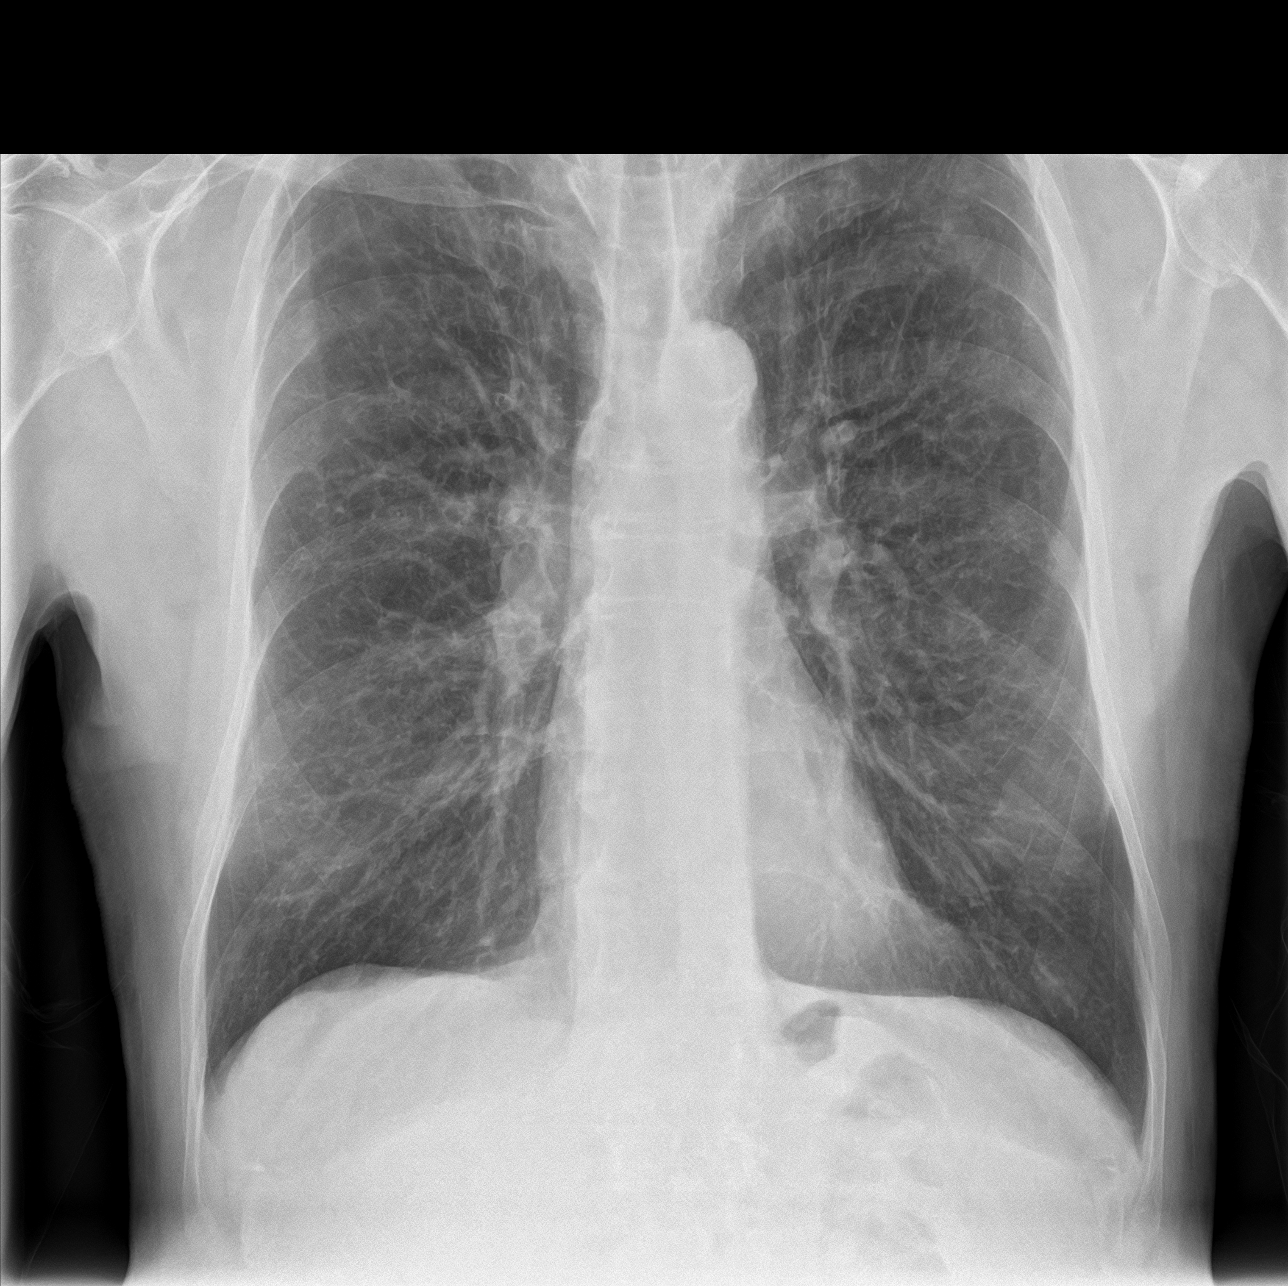

[chest lat (1 of 2)]
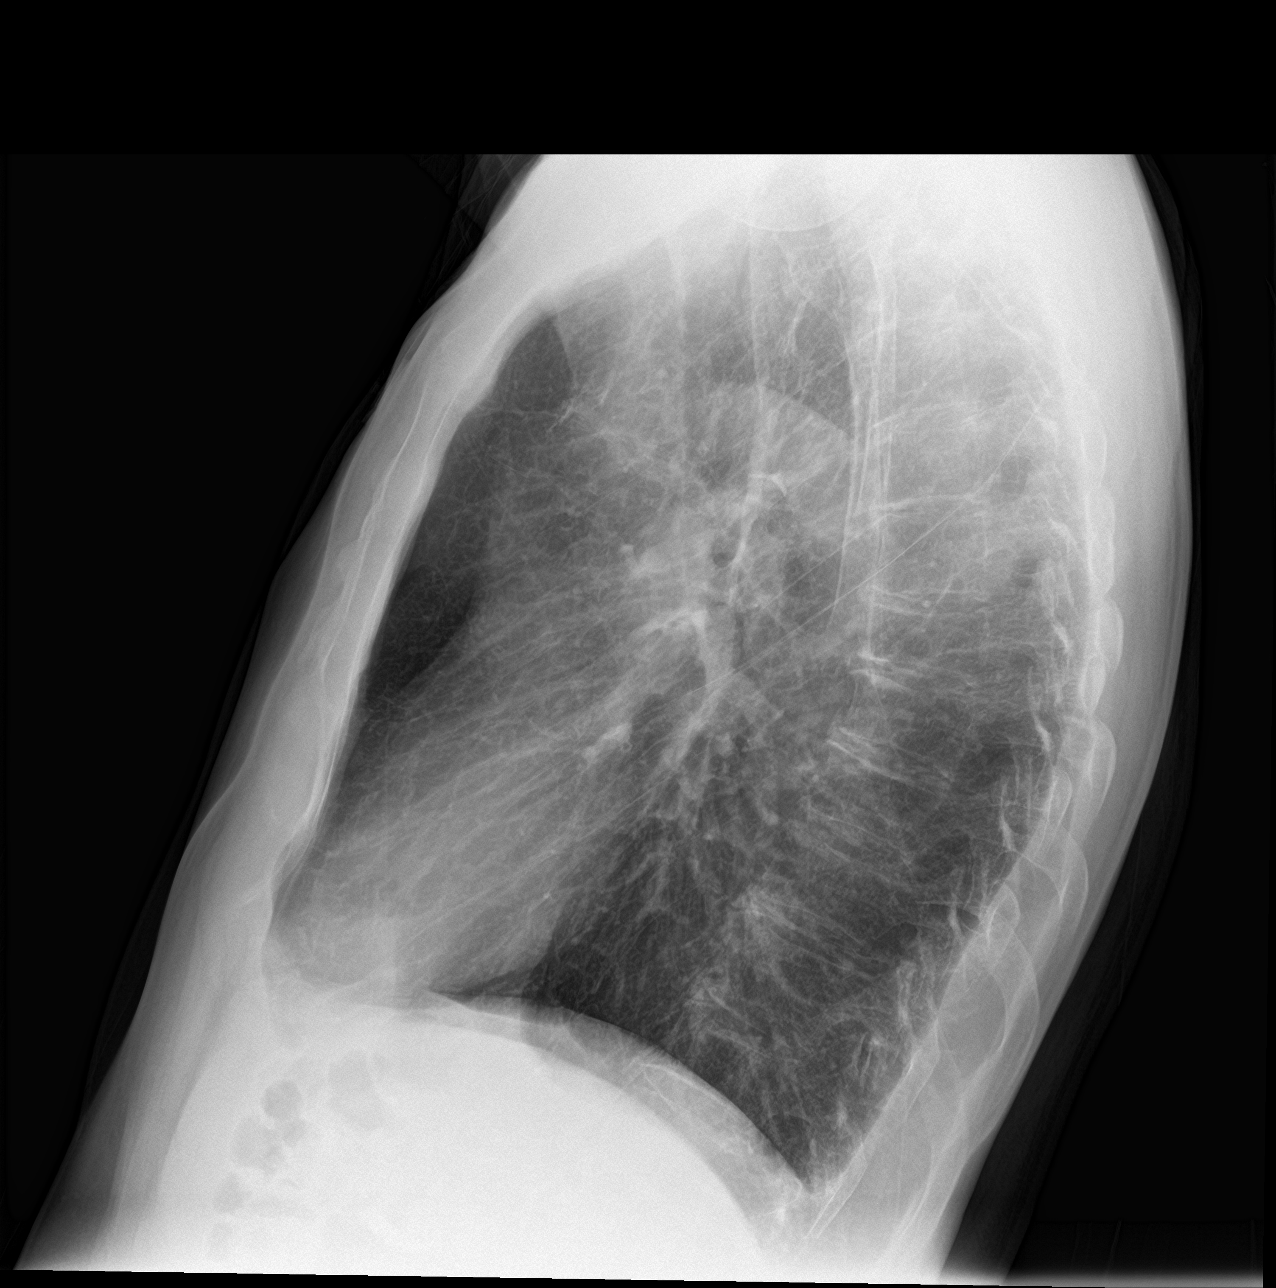

[chest lat (2 of 2)]
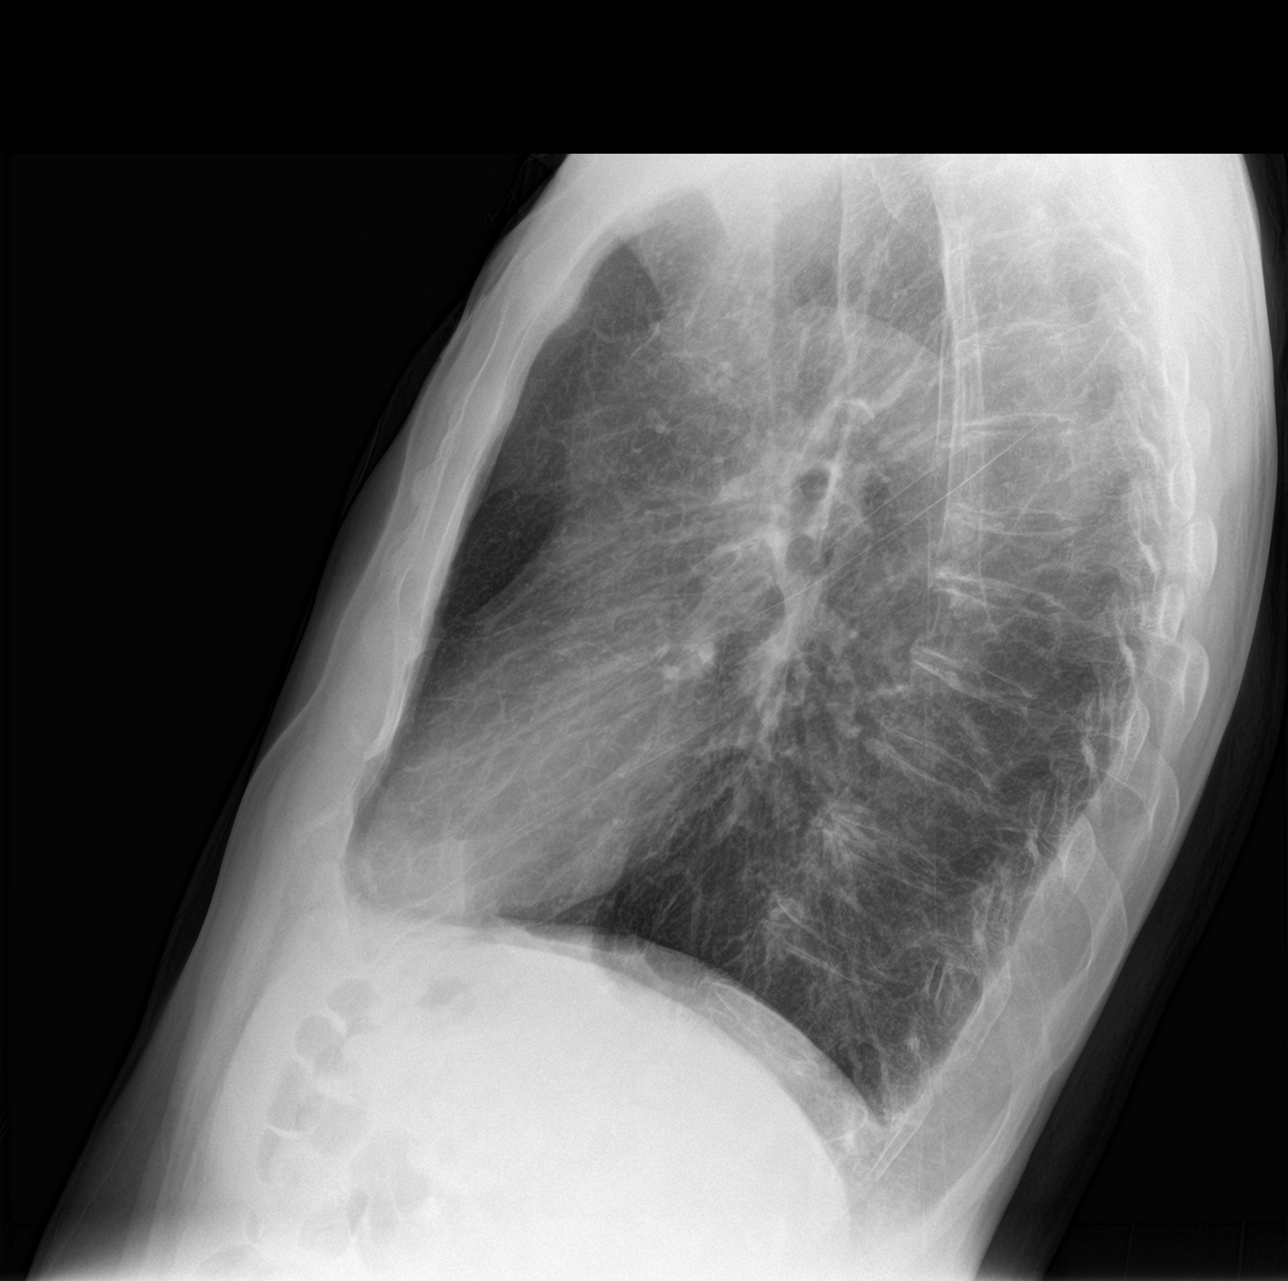

[3 of 3 positions shown; findings below may reference images not displayed]

FINDINGS: The lungs are mildly hyperinflated. The interstitial markings are
coarse. There is biapical pleural thickening. The heart and
pulmonary vascularity are normal. There is calcification in the wall
of the aortic arch. There is no pleural effusion. There is mild
multilevel degenerative disc disease of the thoracic spine.
IMPRESSION: Hyperinflation with increased lung markings is most compatible with
chronic bronchitis and/or smoking related changes. No definite acute
pneumonia nor CHF.

Thoracic aortic atherosclerosis.
